# Patient Record
Sex: Male | Born: 1990 | Race: White | Hispanic: No | Marital: Married | Smoking: Never smoker
Health system: Southern US, Community
[De-identification: ages and names within clinical notes are randomized; demographics above are authoritative.]

---
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
## 2010-06-15 IMAGING — CT CT HEAD WITHOUT CONTRAST
2 series · 16 of 30 positions shown, 20 images · non-contrast
Comparison: none

REASON FOR EXAM: headache,  hit in head 1 week ago
COMMENTS:

[Series 602: without straighten · axial · non-contrast · 0.43mm/px · z∈[+40,+158]mm · 13 of 29 slices shown, 17 images]
[im 3/29  brain]
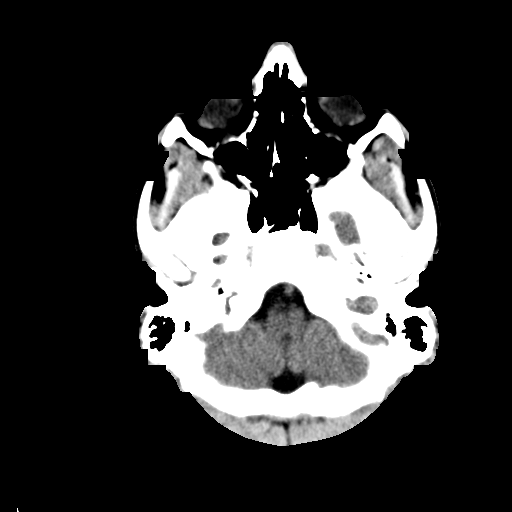
[im 3/29  bone]
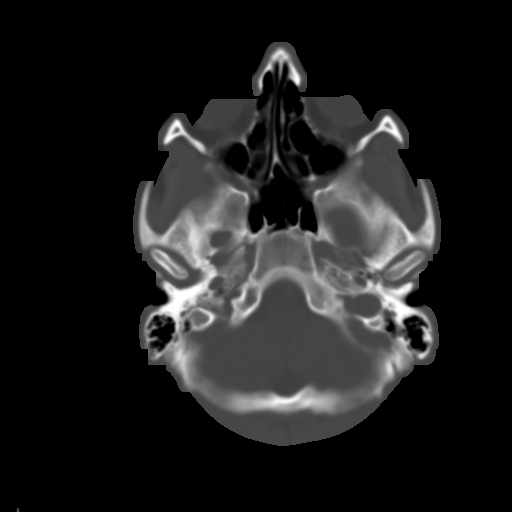
[im 5/29  brain]
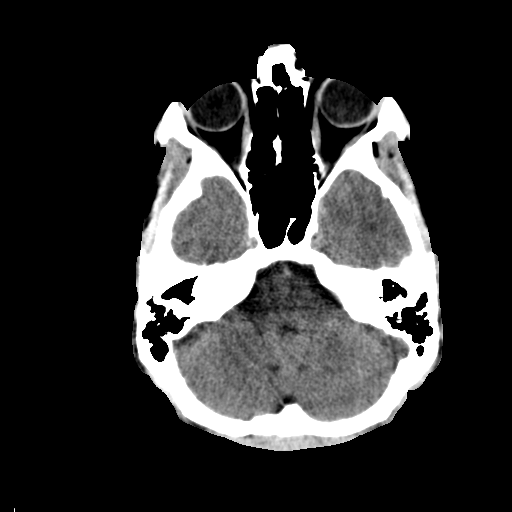
[im 7/29  brain]
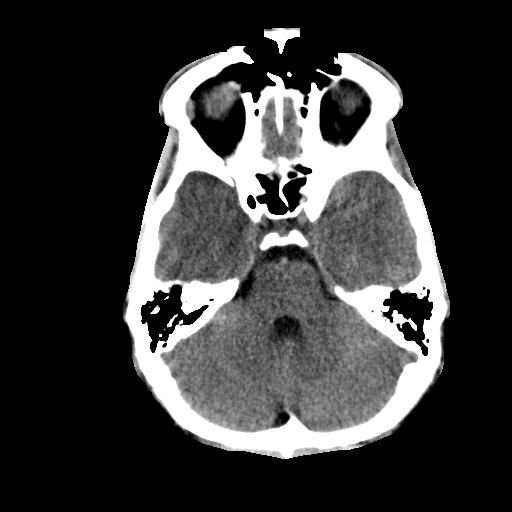
[im 9/29  brain]
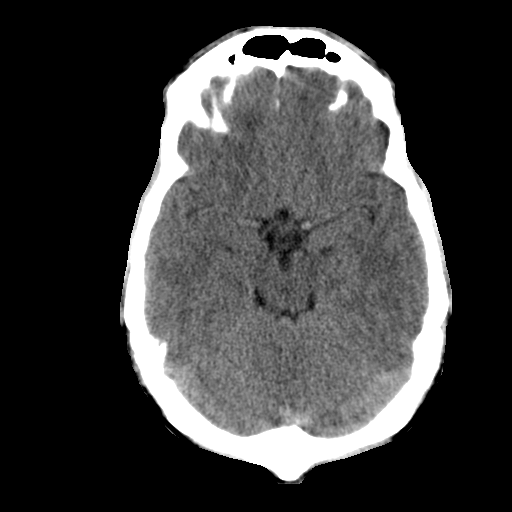
[im 11/29  brain]
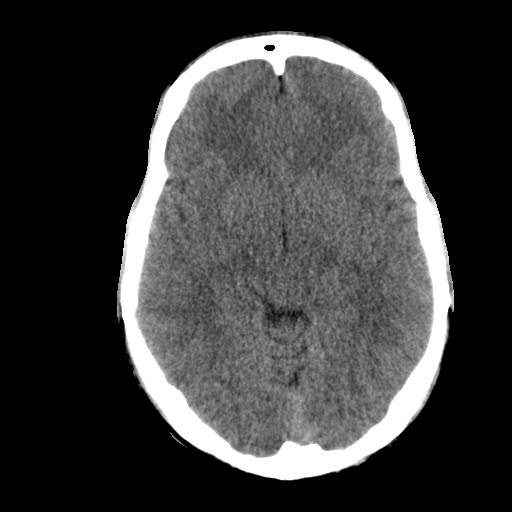
[im 11/29  bone]
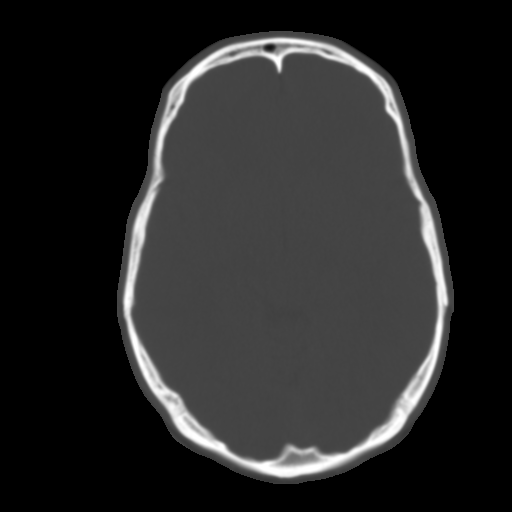
[im 13/29  brain]
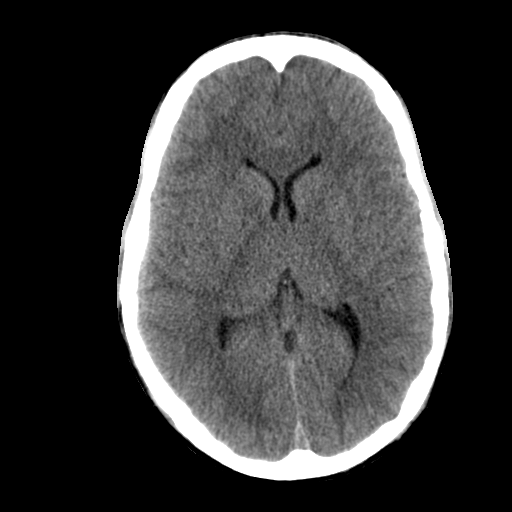
[im 15/29  brain]
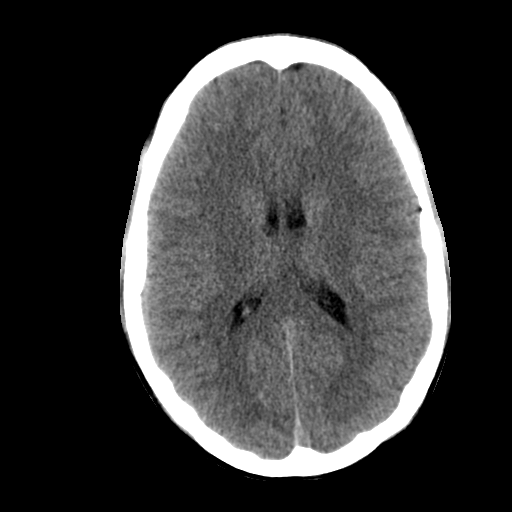
[im 17/29  brain]
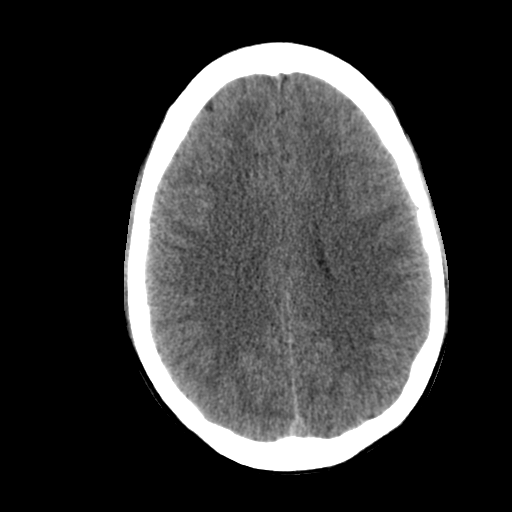
[im 19/29  brain]
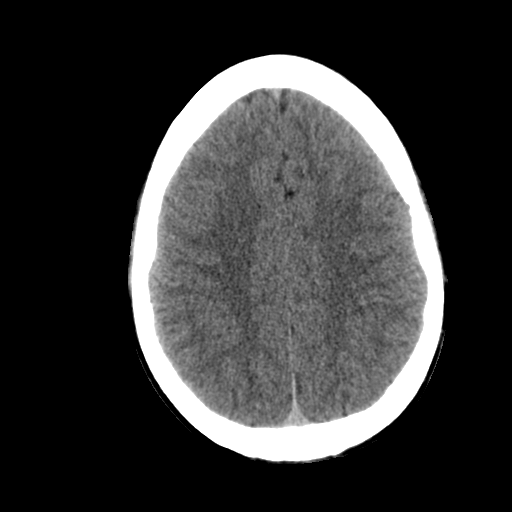
[im 19/29  bone]
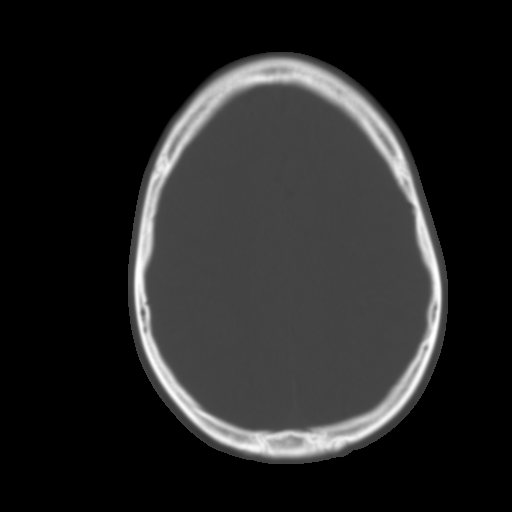
[im 21/29  brain]
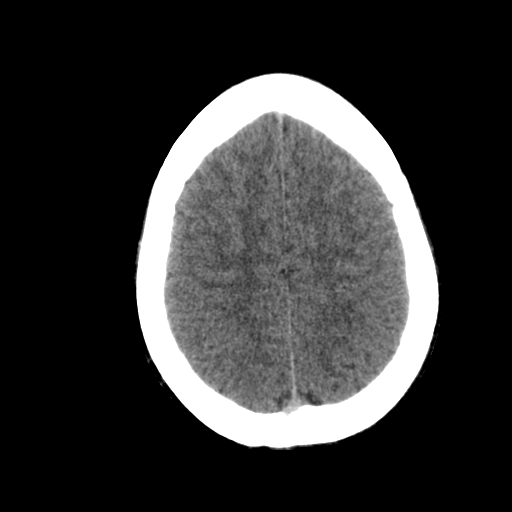
[im 23/29  brain]
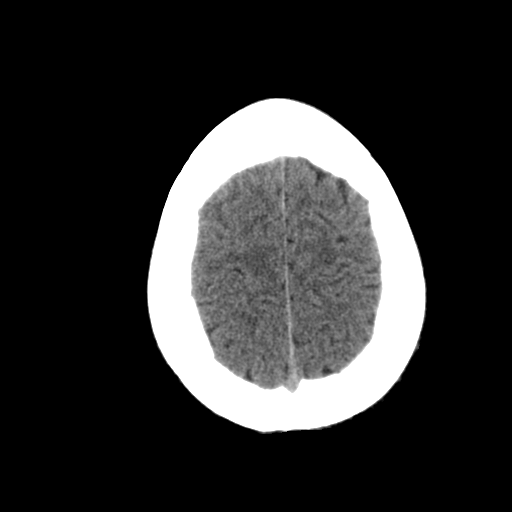
[im 25/29  brain]
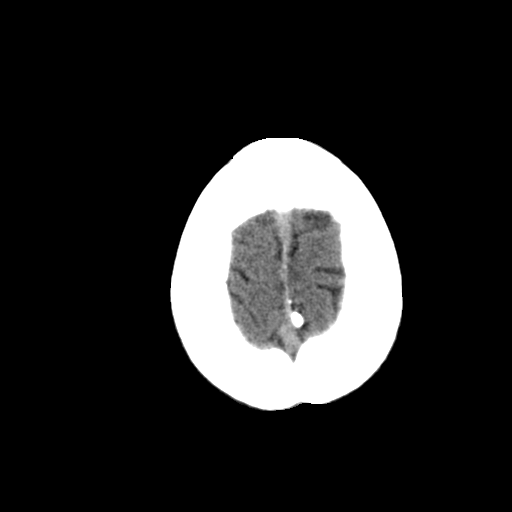
[im 27/29  brain]
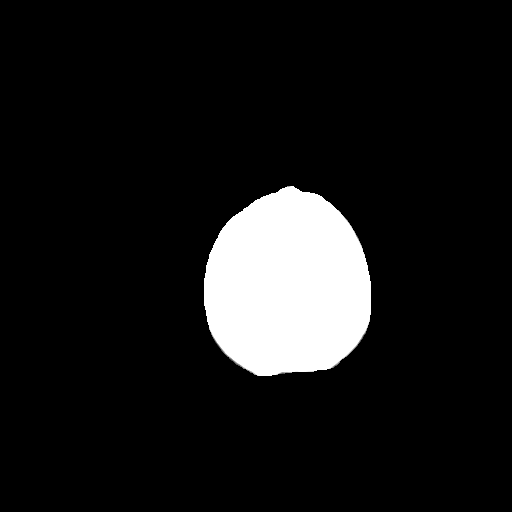
[im 27/29  bone]
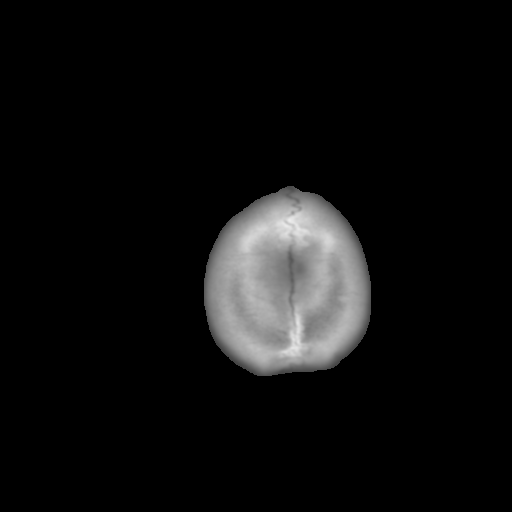

[Series 604: bone straight · axial · 0.43mm/px · z∈[+42,+81]mm · 3 of 30 slices shown]
[im 3/30  bone]
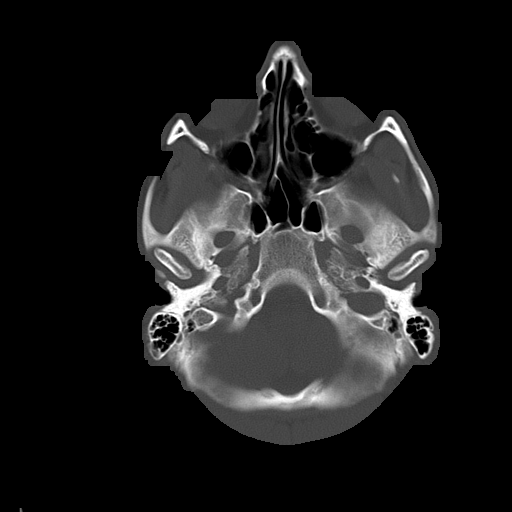
[im 7/30  bone]
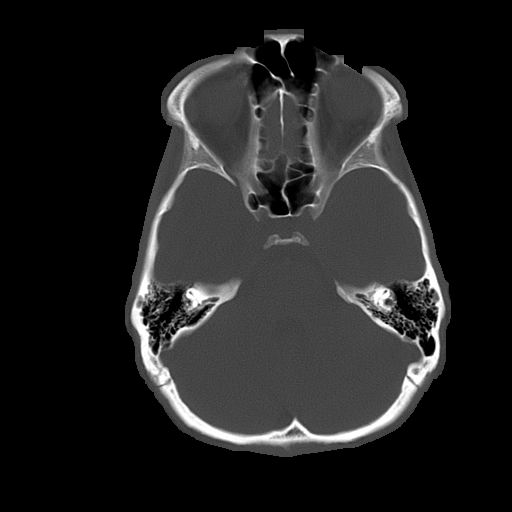
[im 11/30  bone]
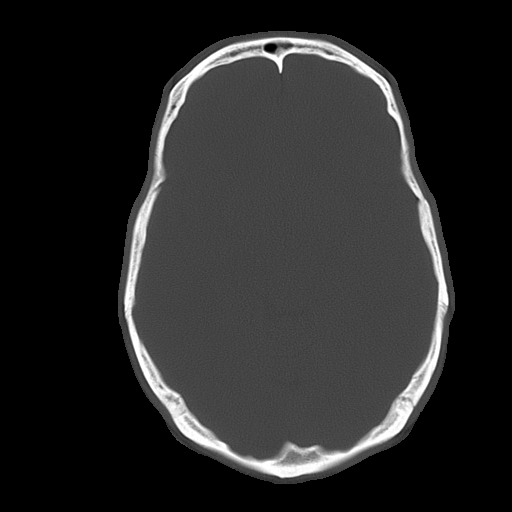

[16 of 30 positions shown; findings below may reference images not displayed]

PROCEDURE:     CT  - CT HEAD WITHOUT CONTRAST  - December 20, 2008  [DATE]

RESULT:     Noncontrast emergent CT of the brain is performed in the
standard fashion. There is no previous examination for comparison.

The ventricles and sulci are normal. There is no hemorrhage. There is no
focal mass, mass-effect or midline shift. There is no evidence of edema or
territorial infarct. The bone windows demonstrate normal aeration of the
paranasal sinuses and mastoid air cells. There is no skull fracture
demonstrated.
IMPRESSION: 1. No acute intracranial abnormality.

## 2012-02-17 HISTORY — PX: WISDOM TOOTH EXTRACTION: SHX21

## 2015-04-10 HISTORY — PX: FOOT SURGERY: SHX648

## 2016-08-31 ENCOUNTER — Ambulatory Visit (INDEPENDENT_AMBULATORY_CARE_PROVIDER_SITE_OTHER): Admitting: Family Medicine

## 2016-08-31 ENCOUNTER — Encounter: Payer: Self-pay | Admitting: Family Medicine

## 2016-08-31 VITALS — BP 117/62 | HR 86 | Temp 98.4°F | Ht 70.0 in | Wt 193.0 lb

## 2016-08-31 DIAGNOSIS — Z13220 Encounter for screening for lipoid disorders: Secondary | ICD-10-CM | POA: Diagnosis not present

## 2016-08-31 DIAGNOSIS — R358 Other polyuria: Secondary | ICD-10-CM | POA: Diagnosis not present

## 2016-08-31 DIAGNOSIS — A6000 Herpesviral infection of urogenital system, unspecified: Secondary | ICD-10-CM | POA: Insufficient documentation

## 2016-08-31 DIAGNOSIS — N62 Hypertrophy of breast: Secondary | ICD-10-CM | POA: Diagnosis not present

## 2016-08-31 DIAGNOSIS — Z833 Family history of diabetes mellitus: Secondary | ICD-10-CM | POA: Diagnosis not present

## 2016-08-31 DIAGNOSIS — R5383 Other fatigue: Secondary | ICD-10-CM

## 2016-08-31 DIAGNOSIS — G44209 Tension-type headache, unspecified, not intractable: Secondary | ICD-10-CM | POA: Insufficient documentation

## 2016-08-31 DIAGNOSIS — A6002 Herpesviral infection of other male genital organs: Secondary | ICD-10-CM | POA: Diagnosis not present

## 2016-08-31 DIAGNOSIS — Z7689 Persons encountering health services in other specified circumstances: Secondary | ICD-10-CM | POA: Diagnosis not present

## 2016-08-31 DIAGNOSIS — N644 Mastodynia: Secondary | ICD-10-CM | POA: Diagnosis not present

## 2016-08-31 DIAGNOSIS — R3589 Other polyuria: Secondary | ICD-10-CM

## 2016-08-31 MED ORDER — VALACYCLOVIR HCL 500 MG PO TABS: 500.0000 mg | ORAL_TABLET | Freq: Every day | ORAL | 3 refills | Status: DC

## 2016-08-31 NOTE — Progress Notes (Signed)
Subjective:    Patient ID: Devin Curtis, male    DOB: [birth year], 26 y.o.   MRN: [id number]  Devin Curtis is a 26 y.o. male presenting on 08/31/2016 for Establish Care (concern about diabetes, because of family history also headaches, drowiness )  Previously seen by medical staff in [employer], in [place], will request records. Now re-located to home in [place] after medical retire from [employer]. He is from [place] originally.  HPI   HEADACHES / FATIGUE / SLEEPINESS: - Reports new concerns that have been gradually present for few months without worsening or improvement, he would like blood testing done to evaluate further. He expresses concern of screening for diabetes with some family history of PreDM. - Symptoms with usually only after work, describes some headaches (frontal bilateral, temples), sensitivity sound and sometimes light, and "random tiredness" not always associated with headaches, often at end of day but can occur other times usually intermittent episodes and will feel normal without fatigue at other times. No known triggers or food/drink relation, not related to alcohol but drinks (drinks beer 1-3 peers weekly, usually on weekend) some caffeine. 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 No significant insomnia. - Takes Ibuprofen 200mg  x 2 per dose PRN about 2-3 x weekly for headaches with very good relief - Caffeine drinks sweet tea / sodas, about 2 cups daily most days, drinks about  4-5 glasses water daily - Wears contact for corrected vision, occasional glasses, no significant problems without contacts - Frequent urination mostly clear to mild yellow urine, 5-6 x per day often, admits some mild inc thirst - Reports that prior lab testing he believes sugar normal, and cholesterol normal. - Admits snoring, but no clear observed apneas per wife - Denies blurry vision, excessive daytime sleepiness, inappropriate falling asleep  History of Elevated BP, never diagnosed with HTN: - No formal diagnosis. Never on treatment for BP. He states had testing before with ECHO, that was normal.  Left Foot Surgery, 03/2015 - Since that time he was on medical leave and then eventually retired from [employer] recently in 2018. Was stationed in [place]. Now home in [place], and plans to go back to school [employer] for degree with heating/AC  GYNECOMASTIA, Male, bilateral with Intermittent Breast Pain - Reports chronic history of enlarged breast tissue since puberty as far as he can remember, gradual worsening over time, but no dramatic acute change with breast tissue. Still present despite regular exercise and weight lifting in [employer]. - He describes chronic intermittent mild discomfort with usually L > R - Family history with mother having breast cancer age >65, no known genetic component - No prior imaging - Admits previous concerns of a "nodule" maybe lymph node or other concern in Left axillary region only temporarily in past, not active - Denies any nipple discharge, persistent abnormal mass or lump or nodule  Genital Herpes Chronic problem for years, was on valtrex 500mg  daily suppression, would like to resume this to lower  flares, has about flare every 2-3 months with outbreak.  -------------------------------------------------------------------------------  Epworth Sleepiness Scale Total Score: 6 (Negative, upper limit of normal sleepiness) Sitting and reading - 1 Watching TV - 1 Sitting  inactive in a public place - 0 As a passenger in a car for an hour without a break - 2 Lying down to rest in the afternoon when circumstances permit - 2 Sitting and talking to someone - 0 Sitting quietly after a lunch without alcohol - 0 In a car, while stopped for a few minutes in traffic - 0  STOP-Bang OSA scoring Snoring yes   Tiredness yes   Observed apneas no   Pressure HTN no   BMI > 35 kg/m2 no   Age > 50  no   Neck (male >17 in; Male >16 in)  no   Gender male yes   OSA risk low (0-2)  OSA risk intermediate (3-4)  OSA risk high (5+)  Total: 3 (Low to Intermediate Risk)    Depression screen [facility] 2/9 08/31/2016  Decreased Interest 0  Down, Depressed, Hopeless 0  PHQ - 2 Score 0  Altered sleeping 0  Tired, decreased energy 0  Change in appetite 0  Feeling bad or failure about yourself  0  Trouble concentrating 0  Moving slowly or fidgety/restless 0  Suicidal thoughts 0  PHQ-9 Score 0   GAD 7 : Generalized Anxiety Score 08/31/2016  Nervous, Anxious, on Edge 0  Control/stop worrying 0  Worry too much - different things 0  Trouble relaxing 0  Restless 0  Easily annoyed or irritable 0  Afraid - awful might happen 0  Total GAD 7 Score 0  Anxiety Difficulty Not difficult at all   History reviewed. No pertinent past medical history. Past Surgical History:  Procedure Laterality Date  . FOOT SURGERY  04/10/2015  . WISDOM TOOTH EXTRACTION  2014   Social History   Social History  . Marital status: Married    Spouse name: N/A  . Number of children: 2  . Years of education: College   Occupational History  . Student [employer])     1-2 year program  . 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 Smoking status: Never Smoker  . Smokeless tobacco: Never Used  . Alcohol use 1.8 oz/week    3 Cans of beer per week     Comment: rarely, occasional  . Drug use: No  . Sexual activity: Yes   Other  Topics Concern  . Not on file   Social History Narrative  . No narrative on file   Family History  Problem Relation Age of Onset  . Cancer Mother 41       ovarian (33) / breast cancer (55)  . Ovarian cancer Mother 50  . Breast cancer Mother 68       remission  . Other Mother        Pre-Diabetes  . Heart disease Father   . Heart attack Father 83   No current outpatient prescriptions on file prior to visit.   No current facility-administered medications on file prior to visit.     Review of Systems  Constitutional: Positive for fatigue. Negative for activity change, appetite change, chills, diaphoresis, fever and unexpected weight change.  HENT: Negative for congestion, hearing loss and sinus pressure.   Eyes: Negative for visual disturbance.  Respiratory: Negative for cough, chest tightness, shortness  of breath and wheezing.   Cardiovascular: Negative for chest pain, palpitations and leg swelling.  Gastrointestinal: Negative for abdominal pain, anal bleeding, blood in stool, constipation, diarrhea, nausea and vomiting.  Endocrine: Positive for polydipsia. Negative for cold intolerance and polyuria.  Genitourinary: Negative for decreased urine volume, difficulty urinating, dysuria, frequency, hematuria, testicular pain and urgency.  Musculoskeletal: Negative for arthralgias, back pain and neck pain.  Skin: Negative for rash.  Allergic/Immunologic: Negative for environmental allergies.  Neurological: Positive for headaches. Negative for dizziness, weakness, light-headedness and numbness.  Hematological: Negative for adenopathy.  Psychiatric/Behavioral: Positive for sleep disturbance. Negative for agitation, behavioral problems, decreased concentration and dysphoric mood. The patient is not nervous/anxious.    Per HPI unless specifically indicated above     Objective:    BP 117/62 (BP Location: Right Arm, Patient Position: Sitting, Cuff Size: Large)   Pulse 86   Temp 98.4 F  (36.9 C) (Oral)   Ht 5\' 10"  (1.778 m)   Wt 193 lb (87.5 kg)   BMI 27.69 kg/m   Wt Readings from Last 3 Encounters:  08/31/16 193 lb (87.5 kg)    Physical Exam  Constitutional: He is oriented to person, place, and time. He appears well-developed and well-nourished. No distress.  Well-appearing, comfortable, cooperative, muscular athletic build  HENT:  Head: Normocephalic and atraumatic.  Mouth/Throat: Oropharynx is clear and moist.  Frontal / maxillary sinuses non-tender. Nares patent without purulence or edema. Bilateral TMs clear without erythema, effusion or bulging. Oropharynx clear without erythema, exudates, edema or asymmetry.  Mallampati Score 1 - Complete visualization of entire oropharynx soft palate  Eyes: Pupils are equal, round, and reactive to light. Conjunctivae and EOM are normal. Right eye exhibits no discharge. Left eye exhibits no discharge.  Neck: Normal range of motion. Neck supple. No thyromegaly present.  Neck circumference 15.5"  No carotid bruit  Cardiovascular: Normal rate, regular rhythm, normal heart sounds and intact distal pulses.   No murmur heard. Pulmonary/Chest: Effort normal and breath sounds normal. No respiratory distress. He has no wheezes. He has no rales.  Abdominal: Soft. Bowel sounds are normal. He exhibits no distension and no mass. There is no tenderness.  Genitourinary:  Genitourinary Comments: Declined male GU and hernia exam  Musculoskeletal: Normal range of motion. He exhibits no edema or tenderness.  Upper / Lower Extremities: - Normal muscle tone, strength bilateral upper extremities 5/5, lower extremities 5/5  Lymphadenopathy:    He has no cervical adenopathy.  Neurological: He is alert and oriented to person, place, and time.  Distal sensation intact to light touch all extremities  Skin: Skin is warm and dry. No rash noted. He is not diaphoretic. No erythema.  Bilateral gynecomastia with symmetrical enlarged breast tissue. No  palpable abnormality or mass, including bilateral axillary breast tissue. Some localized discomfort on deeper palpation L axillary breast.  Psychiatric: He has a normal mood and affect. His behavior is normal.  Well groomed, good eye contact, normal speech and thoughts, does not appear anxious  Nursing note and vitals reviewed.  No results found for this or any previous visit.    Assessment & Plan:   Problem List Items Addressed This Visit    Tension headache    Clinically most consistent with chronic tension headaches for past few months, given bilateral description, worse at end of day, resolve with NSAID, related to some physical stressors - Reassurance, no focal neuro findings on exam or other history to suggest alternative headache - Recommend continue NSAID PRN  vs Tylenol, avoid excessive dosing with one, avoid rebound - Caution caffeine - Recommend headache diary to monitor frequency and triggers - Follow-up      Gynecomastia, male - Primary    Clinically consistent with symmetrical gynecomastia in 26 yr male, seems to be stable chronic but some gradual increase, intermittent episodes of discomfort pain and possible nodularity, none active today - Concern with family history of breast cancer in mother age 45 - No other red flags for patient to suggest exact etiology - Most likely idiopathic since present since puberty  Plan: 1. Discussion and agree with starting imaging given symptomatic - proceed with bilateral breast [facility] at [facility], patient to schedule 2. Lab work-up with chemistry, CBC, and specific gynecomastia work-up testosterone, E2, LH, HCG rule out hormonal or testicular / gonadal etiology 3. Follow-up      Relevant Orders   US BREAST LTD UNI RIGHT INC AXILLA   US BREAST LTD UNI LEFT INC AXILLA   COMPLETE METABOLIC PANEL WITH GFR   CBC with Differential/Platelet   Lipid panel   Hemoglobin A1c   TSH   T4, free   Luteinizing hormone   Testosterone Total,Free,Bio,  Males   B-HCG Quant   Estradiol   Genital herpes    Stable, chronic Restart Valtrex 500mg  daily suppression      Relevant Medications   valACYclovir (VALTREX) 500 MG tablet   Fatigue    Uncertain etiology, most likely normal etiology with several stressors and lifestyle factors / changes, less likely OSA with some negative screening questionnaires, and clinically does not appear consistent based on body type. Unlikely DM without strong history. - Reassurance - Will check basic labs, chemistry, cbc, A1c - Follow-up      Relevant Orders   COMPLETE METABOLIC PANEL WITH GFR   CBC with Differential/Platelet   Hemoglobin A1c   TSH   Breast pain, left    Mild discomfort today on exam, prior intermittent episodes L > R with discomfort axillary and reported nodularity - See A&P gynecomastia Proceed with US imaging bilateral and labs       Other Visit Diagnoses    Encounter to establish care with new doctor       Family history of diabetes mellitus type II       Polyuria       Relevant Orders   Hemoglobin A1c   Screening cholesterol level       Relevant Orders   Lipid panel      Meds ordered this encounter  Medications  . DISCONTD: minocycline (MINOCIN,DYNACIN) 100 MG capsule  . valACYclovir (VALTREX) 500 MG tablet    Sig: Take 1 tablet (500 mg total) by mouth daily.    Dispense:  90 tablet    Refill:  3    Follow up plan: Return in about 6 weeks (around 10/12/2016) for Lab results, Fatigue, Headaches, Gynecomastia.    Saralyn Pilar, DO [facility] 09/01/2016, 7:15 AM

## 2016-08-31 NOTE — Patient Instructions (Addendum)
Thank you for coming to the clinic today.  1. I think most likely Tension headache - Can try Tylenol as needed (extra strength 500mg  1-2 tablets) 1-2 times a day if recurrence of headache - Can continue ibuprofen as needed up to 400-600mg  - Try to avoid overuse of either - Sometimes caffeine can cause tension or rebound headaches as well  Sleep Hygiene Recommendations to promote healthy sleep in all patients, especially if symptoms of insomnia are worsening. Due to the nature of sleep rhythms, if your body gets "out of rhythm", it may take some time before your sleep cycle can be "reset".  Please try to follow as many of the following tips as you can, usually there are only a few of these are the primary cause of the problem.  ?To reset your sleep rhythm, go to bed and get up at the same time every day ?Sleep only long enough to feel rested and then get out of bed ?Do not try to force yourself to sleep. If you can't sleep, get out of bed and try again later. ?Avoid naps during the day, unless excessively tired. The more sleeping during the day, then the less sleep your body needs at night.  ?Have coffee, tea, and other foods that have caffeine only in the morning ?Exercise several days a week, but not right before bed ?If you drink alcohol, prefer to have appropriate drink with one meal, but prefer to avoid alcohol in the evening, and bedtime ?If you smoke, avoid smoking, especially in the evening  ?Avoid watching TV or looking at phones, computers, or reading devices ("e-books") that give off light at least 30 minutes before bed. This artificial light sends "awake signals" to your brain and can make it harder to fall asleep. ?Make your bedroom a comfortable place where it is easy to fall asleep: ? Put up shades or special blackout curtains to block light from outside. ? Use a white noise machine to block noise. ? Keep the temperature cool. ?Try your best to solve or at least address  your problems before you go to bed ?Use relaxation techniques to manage stress. Ask your health care provider to suggest some techniques that may work well for you. These may include: ? Breathing exercises. ? Routines to release muscle tension. ? Visualizing peaceful scenes.  Find out if your insurance company requires an Annual Physical - if so, need to schedule next apt within 7 days of blood work for  PHYSICAL - or if not required, can be an office visit for FOLLOW-UP LABS  For Breast Ultrasound  Call the Imaging Center below anytime to schedule your own appointment now that order has been placed.  [facility] [address] [place], [place] [address] Phone: [phone]  You will be due for FASTING BLOOD WORK (no food or drink after midnight before, only water or coffee without cream/sugar on the morning of)  - Please go ahead and schedule a "Lab Only" visit in the morning at the clinic for lab draw in 1 week on Weds 09/09/16  - Make sure Lab Only appointment is at least 1-2 weeks before your next appointment, so that results will be available  For Lab Results, once available within 2-3 days of blood draw, you can can log in to MyChart online to view your results and a brief explanation. Also, we can discuss results at next follow-up visit.  Please schedule a Follow-up Appointment to: Return in  about 6 weeks (around 10/12/2016) for Lab results, Fatigue, Headaches, Gynecomastia.  If you have any other questions or concerns, please feel free to call the clinic or send a message through MyChart. You may also schedule an earlier appointment if necessary.  Additionally, you may be receiving a survey about your experience at our clinic within a few days to 1 week by e-mail or mail. We value your feedback.  Saralyn Pilar, DO [facility]   Gynecomastia, Adult Gynecomastia is an overgrowth of gland tissue  in a man's breasts. This may cause one or both breasts to become enlarged. This often develops in men who have an imbalance of the male sex hormone (testosterone) and the male sex hormone (estrogen). This means that a man may have too much estrogen, too little testosterone, or both. Gynecomastia may be a normal part of aging for some men. It can also happen to adolescent boys during puberty. What are the causes? Gynecomastia may be caused by:  Certain medicines, such as: ? Estrogen supplements and medicines that act like estrogen in the body. ? Medicines that keep testosterone from functioning normally in the body (testosterone-inhibiting drugs). ? Anabolic steroids. ? Medicines to treat heartburn, cancer, heart disease, mental health problems, HIV (human immunodeficiency virus) or AIDS (acquired immunodeficiency syndrome). ? Antibiotic medicine. ? Chemotherapy medicine.  Recreational drugs, including alcohol, marijuana, and opioids.  Herbal products, including lavender and tea tree oil.  A gene that is passed along from parent to child (inherited).  Tumors in the pituitary or adrenal gland.  An overactive thyroid gland.  Certain inherited disorders, including a genetic disease that causes low testosterone in males (Klinefelter syndrome).  Cancer of the lung, kidney, liver, testicle, or gastrointestinal tract.  Conditions that cause liver or kidney failure.  Poor nutrition and starvation.  Testicle shrinking or failure (testicularatrophy).  In some cases, the cause may not be known. What increases the risk? You may have a higher risk for gynecomastia if you:  Are 35 years old or older.  Are overweight.  Abuse alcohol or other drugs.  Have a family history of gynecomastia.  What are the signs or symptoms?  Most of the time, breast enlargement is the only symptom. The enlargement may start near the nipple, and the breast tissue may feel firm and rubbery. The breast may  feel itchy, painful or tender. How is this diagnosed? This condition may be diagnosed based on:  Your symptoms.  Your medical history.  A physical exam.  Imaging tests, such as: ? An ultrasound. ? A mammogram. ? An MRI.  Blood tests.  Removal of a sample of breast tissue to be tested in a lab (biopsy).  How is this treated? Gynecomastia may go away on its own, without treatment. If gynecomastia is caused by a medical problem or drug abuse, treatment may include:  Getting treatment for the underlying medical problem or for drug abuse.  Changing or stopping medicines.  Medicines to block the effects of estrogen.  Taking a testosterone replacement.  Surgery to remove breast tissue or any lumps in your breasts.  Breast reduction surgery. This may be a possibility if you have severe or painful gynecomastia.  Follow these instructions at home:  Take over-the-counter and prescription medicines only as told by your health care provider.  Talk to your health care provider before taking any herbal medicines or diet supplements.  Do not abuse drugs or alcohol.  Keep all follow-up visits as told by your health  care provider. This is important. Contact a health care provider if:  Your breast tissue grows larger or gets more swollen or painful.  You have a lump in your testicle.  You have blood or discharge coming from your nipples.  Your nipple changes shape.  You develop a hard or painful lump in your breast. This information is not intended to replace advice given to you by your health care provider. Make sure you discuss any questions you have with your health care provider. Document Released: 03/29/2015 Document Revised: 07/12/2015 Document Reviewed: 03/29/2015 Elsevier Interactive Patient Education  [employer]2018 [employer] Inc[employer]

## 2016-09-01 ENCOUNTER — Encounter: Payer: Self-pay | Admitting: Family Medicine

## 2016-09-01 NOTE — Assessment & Plan Note (Signed)
Stable, chronic Restart Valtrex 500mg  daily suppression

## 2016-09-01 NOTE — Assessment & Plan Note (Signed)
Mild discomfort today on exam, prior intermittent episodes L > R with discomfort axillary and reported nodularity - See A&P gynecomastia Proceed with US imaging bilateral and labs

## 2016-09-01 NOTE — Assessment & Plan Note (Signed)
Clinically consistent with symmetrical gynecomastia in [age] yr male, seems to be stable chronic but some gradual increase, intermittent episodes of discomfort pain and possible nodularity, none active today - Concern with family history of breast cancer in mother age 26 - No other red flags for patient to suggest exact etiology - Most likely idiopathic since present since puberty  Plan: 1. Discussion and agree with starting imaging given symptomatic - proceed [facility], patient to schedule 2. Lab work-up with chemistry, CBC, and specific gynecomastia work-up testosterone, E2, LH, HCG rule out hormonal or testicular / gonadal etiology 3. Follow-up

## 2016-09-01 NOTE — Assessment & Plan Note (Signed)
Clinically most consistent with chronic tension headaches for past few months, given bilateral description, worse at end of day, resolve with NSAID, related to some physical stressors - Reassurance, no focal neuro findings on exam or other history to suggest alternative headache - Recommend continue NSAID PRN vs Tylenol, avoid excessive dosing with one, avoid rebound - Caution caffeine - Recommend headache diary to monitor frequency and triggers - Follow-up

## 2016-09-01 NOTE — Assessment & Plan Note (Signed)
Uncertain etiology, most likely normal etiology with several stressors and lifestyle factors / changes, less likely OSA with some negative screening questionnaires, and clinically does not appear consistent based on body type. Unlikely DM without strong history. - Reassurance - Will check basic labs, chemistry, cbc, A1c - Follow-up

## 2016-09-08 ENCOUNTER — Ambulatory Visit (INDEPENDENT_AMBULATORY_CARE_PROVIDER_SITE_OTHER): Payer: TRICARE For Life (TFL) | Admitting: Podiatry

## 2016-09-08 ENCOUNTER — Ambulatory Visit (INDEPENDENT_AMBULATORY_CARE_PROVIDER_SITE_OTHER): Payer: TRICARE For Life (TFL)

## 2016-09-08 ENCOUNTER — Encounter: Payer: Self-pay | Admitting: Podiatry

## 2016-09-08 ENCOUNTER — Ambulatory Visit: Payer: TRICARE For Life (TFL)

## 2016-09-08 DIAGNOSIS — Z969 Presence of functional implant, unspecified: Secondary | ICD-10-CM | POA: Diagnosis not present

## 2016-09-08 DIAGNOSIS — M722 Plantar fascial fibromatosis: Secondary | ICD-10-CM

## 2016-09-08 DIAGNOSIS — M2010 Hallux valgus (acquired), unspecified foot: Secondary | ICD-10-CM

## 2016-09-08 DIAGNOSIS — R52 Pain, unspecified: Secondary | ICD-10-CM

## 2016-09-08 DIAGNOSIS — L905 Scar conditions and fibrosis of skin: Secondary | ICD-10-CM | POA: Diagnosis not present

## 2016-09-08 MED ORDER — BETAMETHASONE SOD PHOS & ACET 6 (3-3) MG/ML IJ SUSP: 3.0000 mg | Freq: Once | INTRAMUSCULAR | Status: DC

## 2016-09-08 MED ORDER — DICLOFENAC SODIUM 75 MG PO TBEC: 75.0000 mg | DELAYED_RELEASE_TABLET | Freq: Two times a day (BID) | ORAL | 0 refills | Status: DC

## 2016-09-08 NOTE — Progress Notes (Signed)
   Subjective: 26 year old otherwise healthy male presents the office today for evaluation of complications following a left foot bunionectomy was performed in 2017. Patient states that he's had joint pain ever since the surgery and has a history of bilateral arch pain. Patient was in the [employer] and experienced significant foot pathology during [employer] service.  Objective: Physical Exam General: The patient is alert and oriented x3 in no acute distress.  Dermatology: Hypertrophic scar formation noted overlying the first metatarsal approximately 4 cm in length. Skin is cool, dry and supple bilateral lower extremities. Negative for open lesions or macerations.  Vascular: Palpable pedal pulses bilaterally. No edema or erythema noted. Capillary refill within normal limits.  Neurological: Epicritic and protective threshold grossly intact bilaterally.   Musculoskeletal Exam:  Clinical evidence of bunion deformity noted to the respective foot. There is a moderate pain on palpation range of motion of the first MPJ. Lateral deviation of the hallux noted consistent with hallux abductovalgus. There is also some prominent painful orthopedic screws noted along the first metatarsal that are palpable and tenting the skin. Pain on palpation also noted to the medial calcaneal tubercle of the bilateral heels extending distally along the plantar arch.  Radiographic Exam: History of bunionectomy to the first ray in 2017 is consistent with radiographic findings. There are 2 prominent orthopedic screws in the first metatarsal. There also continues to be a mild hallux valgus deformity approximately 30. Intermetatarsal angle continues to be approximately 15. Degenerative changes also noted to the first metatarsal phalangeal joint of the left foot with joint space narrowing and sclerotic changes.  Assessment: 1. 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 Painful orthopedic hardware 2 left foot 3. Iatrogenic  painful scar formation left foot 4. Plantar fasciitis bilateral  Plan of Care:  1. Patient was evaluated. 2. Today we discussed the conservative versus surgical management of bunion deformity as well as revision of painful scar formation and removal orthopedic hardware 2 of the left foot. Today the patient opts for surgical management. All possible complications and details the procedure were explained. All patient questions were answered. No guarantees were expressed or implied. 3.  Authorization for surgery initiated today. Surgery will consist of removal painful hardware 2 left foot. Scar revision left foot. Cheilectomy left foot. 4. Orthopedic postoperative shoe was dispensed today 5. Injection of 0.5 mL Celestone Soluspan injected in the patient's bilateral heels 6. Plantar fascial braces dispensed bilateral 7. Prescription for diclofenac 75 mg 8. Return to clinic 1 week postop   Felecia ShellingBrent M. Keniya Schlotterbeck, DPM [facility]  Dr. Felecia ShellingBrent M. Estie Sproule, DPM    [address]                                        [place], [place] [address]                Office [phone]  Fax [phone]

## 2016-09-08 NOTE — Patient Instructions (Signed)
Pre-Operative Instructions  Congratulations, you have decided to take an important step to improving your quality of life.  You can be assured that the doctors of [facility] will be with you every step of the way.  1. Plan to be at the surgery center/hospital at least 1 (one) hour prior to your scheduled time unless otherwise directed by the surgical center/hospital staff.  You must have a responsible adult accompany you, remain during the surgery and drive you home.  Make sure you have directions to the surgical center/hospital and know how to get there on time. 2. For hospital based surgery you will need to obtain a history and physical form from your family physician within 1 month prior to the date of surgery- we will give you a form for you primary physician.  3. We make every effort to accommodate the date you request for surgery.  There are however, times where surgery dates or times have to be moved.  We will contact you as soon as possible if a change in schedule is required.   4. No Aspirin/Ibuprofen for one week before surgery.  If you are on aspirin, any non-steroidal anti-inflammatory medications (Mobic, Aleve, Ibuprofen) you should stop taking it 7 days prior to your surgery.  You make take Tylenol  For pain prior to surgery.  5. Medications- If you are taking daily heart and blood pressure medications, seizure, reflux, allergy, asthma, anxiety, pain or diabetes medications, make sure the surgery center/hospital is aware before the day of surgery so they may notify you which medications to take or avoid the day of surgery. 6. No food or drink after midnight the night before surgery unless directed otherwise by surgical center/hospital staff. 7. No alcoholic beverages 24 hours prior to surgery.  No smoking 24 hours prior to or 24 hours after surgery. 8. Wear loose pants or shorts- loose enough to fit over bandages, boots, and casts. 9. No slip on shoes, sneakers are best. 10. Bring  your boot with you to the surgery center/hospital.  Also bring crutches or a walker if your physician has prescribed it for you.  If you do not have this equipment, it will be provided for you after surgery. 11. If you have not been contracted by the surgery center/hospital by the day before your surgery, call to confirm the date and time of your surgery. 12. Leave-time from work may vary depending on the type of surgery you have.  Appropriate arrangements should be made prior to surgery with your employer. 13. Prescriptions will be provided immediately following surgery by your doctor.  Have these filled as soon as possible after surgery and take the medication as directed. 14. Remove nail polish on the operative foot. 15. Wash the night before surgery.  The night before surgery wash the foot and leg well with the antibacterial soap provided and water paying special attention to beneath the toenails and in between the toes.  Rinse thoroughly with water and dry well with a towel.  Perform this wash unless told not to do so by your physician.  Enclosed: 1 Ice pack (please put in freezer the night before surgery)   1 Hibiclens skin cleaner   Pre-op Instructions  If you have any questions regarding the instructions, do not hesitate to call our office.  [place]: [address] [place], [place] [address] [phone]  [place]: [address] [place], [place] [address] [phone]  [place]: [address]  [place], [place] [address] [phone]   Dr.   Norman Regal DPM, Dr. Matthew Wagoner DPM, Dr. M. Todd Hyatt DPM, Dr. Titorya Stover DPM 

## 2016-09-09 ENCOUNTER — Other Ambulatory Visit

## 2016-09-09 DIAGNOSIS — R5383 Other fatigue: Secondary | ICD-10-CM

## 2016-09-09 DIAGNOSIS — R358 Other polyuria: Secondary | ICD-10-CM

## 2016-09-09 DIAGNOSIS — N62 Hypertrophy of breast: Secondary | ICD-10-CM

## 2016-09-09 DIAGNOSIS — Z13220 Encounter for screening for lipoid disorders: Secondary | ICD-10-CM

## 2016-09-09 DIAGNOSIS — R3589 Other polyuria: Secondary | ICD-10-CM

## 2016-09-09 DIAGNOSIS — N644 Mastodynia: Secondary | ICD-10-CM

## 2016-09-09 LAB — CBC WITH DIFFERENTIAL/PLATELET
Basophils Absolute: 0 cells/uL (ref 0–200)
Basophils Relative: 0 %
Eosinophils Absolute: 0 cells/uL — ABNORMAL LOW (ref 15–500)
Eosinophils Relative: 0 %
HCT: 46.7 % (ref 38.5–50.0)
Hemoglobin: 16 g/dL (ref 13.2–17.1)
Lymphocytes Relative: 15 %
Lymphs Abs: 2070 cells/uL (ref 850–3900)
MCH: 29.3 pg (ref 27.0–33.0)
MCHC: 34.3 g/dL (ref 32.0–36.0)
MCV: 85.5 fL (ref 80.0–100.0)
MPV: 9.9 fL (ref 7.5–12.5)
Monocytes Absolute: 552 cells/uL (ref 200–950)
Monocytes Relative: 4 %
Neutro Abs: 11178 cells/uL — ABNORMAL HIGH (ref 1500–7800)
Neutrophils Relative %: 81 %
Platelets: 279 10*3/uL (ref 140–400)
RBC: 5.46 MIL/uL (ref 4.20–5.80)
RDW: 13.3 % (ref 11.0–15.0)
WBC: 13.8 10*3/uL — ABNORMAL HIGH (ref 3.8–10.8)

## 2016-09-09 LAB — COMPLETE METABOLIC PANEL WITH GFR
ALT: 26 U/L (ref 9–46)
AST: 17 U/L (ref 10–40)
Albumin: 4.5 g/dL (ref 3.6–5.1)
Alkaline Phosphatase: 83 U/L (ref 40–115)
BUN: 12 mg/dL (ref 7–25)
CO2: 25 mmol/L (ref 20–31)
Calcium: 9.2 mg/dL (ref 8.6–10.3)
Chloride: 103 mmol/L (ref 98–110)
Creat: 1.1 mg/dL (ref 0.60–1.35)
GFR, Est African American: >89 mL/min (ref >=60)
GFR, Est Non African American: >89 mL/min (ref >=60)
Glucose, Bld: 111 mg/dL — ABNORMAL HIGH (ref 65–99)
Potassium: 4 mmol/L (ref 3.5–5.3)
Sodium: 139 mmol/L (ref 135–146)
Total Bilirubin: 0.7 mg/dL (ref 0.2–1.2)
Total Protein: 6.9 g/dL (ref 6.1–8.1)

## 2016-09-09 LAB — LIPID PANEL
Cholesterol: 206 mg/dL — ABNORMAL HIGH (ref <200)
HDL: 36 mg/dL — ABNORMAL LOW (ref >40)
LDL Cholesterol: 146 mg/dL — ABNORMAL HIGH (ref <100)
Total CHOL/HDL Ratio: 5.7 Ratio — ABNORMAL HIGH (ref <5.0)
Triglycerides: 120 mg/dL (ref <150)
VLDL: 24 mg/dL (ref <30)

## 2016-09-09 LAB — T4, FREE: Free T4: 1.2 ng/dL (ref 0.8–1.8)

## 2016-09-09 LAB — TSH: TSH: 0.99 mIU/L (ref 0.40–4.50)

## 2016-09-10 LAB — LUTEINIZING HORMONE: LH: 6.3 m[IU]/mL (ref 1.5–9.3)

## 2016-09-10 LAB — HEMOGLOBIN A1C
Hgb A1c MFr Bld: 4.9 % (ref <5.7)
Mean Plasma Glucose: 94 mg/dL

## 2016-09-10 LAB — HCG, QUANTITATIVE, PREGNANCY: hCG, Beta Chain, Quant, S: <2 m[IU]/mL

## 2016-09-10 LAB — ESTRADIOL: Estradiol: 28 pg/mL (ref <=39)

## 2016-09-11 LAB — TESTOSTERONE TOTAL,FREE,BIO, MALES
Albumin: 4.5 g/dL (ref 3.6–5.1)
Sex Hormone Binding: 23 nmol/L (ref 10–50)
Testosterone, Bioavailable: 92.3 ng/dL — ABNORMAL LOW (ref 110.0–575.0)
Testosterone, Free: 44.9 pg/mL — ABNORMAL LOW (ref 46.0–224.0)
Testosterone: 271 ng/dL (ref 250–827)

## 2016-09-13 ENCOUNTER — Encounter: Payer: Self-pay | Admitting: Family Medicine

## 2016-09-15 ENCOUNTER — Encounter: Payer: Self-pay | Admitting: Podiatry

## 2016-09-15 ENCOUNTER — Telehealth: Payer: Self-pay | Admitting: Podiatry

## 2016-09-15 NOTE — Telephone Encounter (Signed)
I'm calling because Dr. Logan BoresEvans is going to perform my surgery but I haven't gotten an exact date yet. I wanted to know if I could get a note or letter for my job about possibly going on light duty or recommendation to take time off for my surgery. If you could please call me back at [phone].

## 2016-09-18 ENCOUNTER — Other Ambulatory Visit: Payer: Self-pay | Admitting: Family Medicine

## 2016-09-18 DIAGNOSIS — N62 Hypertrophy of breast: Secondary | ICD-10-CM

## 2016-09-24 ENCOUNTER — Ambulatory Visit
Admission: RE | Admit: 2016-09-24 | Discharge: 2016-09-24 | Disposition: A | Discharge status: Home / Self Care | Source: Ambulatory Visit | Attending: Family Medicine | Admitting: Family Medicine

## 2016-09-24 ENCOUNTER — Ambulatory Visit: Admission: RE | Admit: 2016-09-24 | Source: Ambulatory Visit

## 2016-09-24 DIAGNOSIS — N62 Hypertrophy of breast: Secondary | ICD-10-CM | POA: Insufficient documentation

## 2016-09-24 DIAGNOSIS — N644 Mastodynia: Secondary | ICD-10-CM | POA: Diagnosis present

## 2016-09-25 ENCOUNTER — Telehealth: Payer: Self-pay | Admitting: *Deleted

## 2016-09-25 NOTE — Telephone Encounter (Signed)
I am following up with you about scheduling your surgery.  "I am supposed to already be scheduled for August 23."  I just wanted to make sure, I saw a note but it wasn't written on your your surgical form.  I will get it scheduled.  "Has everything been authorized by my insurance?"  I have not checked with your insurance yet but I will make sure everything is authorized prior to surgery date.

## 2016-10-01 ENCOUNTER — Telehealth: Payer: Self-pay | Admitting: *Deleted

## 2016-10-01 NOTE — Telephone Encounter (Signed)
I called and asked LouAnn to make him the last patient of the day upon patient's request.

## 2016-10-01 NOTE — Telephone Encounter (Addendum)
"  I am scheduled for surgery on next Thursday, the twenty-third.  I don't think I will be able to do it due to school.  The program I'm in I have to be in a closed shoe.  They will not let me to go into class with the surgery boot on. I'm trying to schedule something after December after the 13th.  Give me a call back.  I'll be in class until 12 pm."   "This is Devin Curtis again.  I'm sorry to blow up your voicemail like this.  I think I will actually be able to keep that surgery date.  I talked to the instructor.  He said I should be fine in the walking boot or shoe.  I was going to see if I have any options of getting an afternoon appointment that way I wouldn't have to miss class.  Please give me a call back."  "I'm calling to see if I can have my surgery after 12 pm."  You will have to call the surgical center to make that arrangement.  "Okay, I'll give them a call."

## 2016-10-06 ENCOUNTER — Telehealth: Payer: Self-pay | Admitting: *Deleted

## 2016-10-06 NOTE — Telephone Encounter (Signed)
"  I called and left you a message yesterday.  I need to reschedule my surgery to December.  I have classes and I can't miss too many days or I will fail.  I'll be on my winter break after December 13 to January.  So, that will give me time to heal."  Dr. Logan Bores can do it on December 20.  "That date will be fine."  I'll get it rescheduled.

## 2016-10-12 ENCOUNTER — Ambulatory Visit (INDEPENDENT_AMBULATORY_CARE_PROVIDER_SITE_OTHER): Admitting: Family Medicine

## 2016-10-12 ENCOUNTER — Other Ambulatory Visit: Payer: Self-pay | Admitting: Family Medicine

## 2016-10-12 ENCOUNTER — Encounter: Payer: Self-pay | Admitting: Family Medicine

## 2016-10-12 VITALS — BP 134/75 | HR 93 | Temp 98.6°F | Ht 70.0 in | Wt 200.5 lb

## 2016-10-12 DIAGNOSIS — E782 Mixed hyperlipidemia: Secondary | ICD-10-CM

## 2016-10-12 DIAGNOSIS — G44209 Tension-type headache, unspecified, not intractable: Secondary | ICD-10-CM

## 2016-10-12 DIAGNOSIS — N62 Hypertrophy of breast: Secondary | ICD-10-CM

## 2016-10-12 DIAGNOSIS — R5383 Other fatigue: Secondary | ICD-10-CM | POA: Diagnosis not present

## 2016-10-12 DIAGNOSIS — D72829 Elevated white blood cell count, unspecified: Secondary | ICD-10-CM

## 2016-10-12 DIAGNOSIS — E785 Hyperlipidemia, unspecified: Secondary | ICD-10-CM | POA: Insufficient documentation

## 2016-10-12 DIAGNOSIS — R7309 Other abnormal glucose: Secondary | ICD-10-CM

## 2016-10-12 DIAGNOSIS — Z0000 Encounter for general adult medical examination without abnormal findings: Secondary | ICD-10-CM

## 2016-10-12 NOTE — Progress Notes (Signed)
Subjective:    Patient ID: Devin Curtis, male    DOB: 1990/12/07, 26 y.o.   MRN: [id number]  WYETH HOFFER is a 26 y.o. male presenting on 10/12/2016 for gynecomastia (follow up lab result)  HPI   FOLLOW-UP FATIGUE / SLEEPINESS: - Last visit with me 08/31/16, for initial visit to establish care, discussed same problem, no specific treatment given, advised sleep hygiene, blood work, see prior notes for background information. - Interval update with dramatic improvement and mostly resolution of symptoms after schedule change from working at [employer] early AM hours and poor sleep, to now back to school and has normal hours - Today patient reports no new concerns. Pleased with mostly normal lab results and normal blood sugar, due to fam history diabetes. - Regarding headaches, still has occasional stress tension headache but now much less often with better sleep, not taking OTC medicines regularly, usually self limited - Limiting caffeine - Still regular exercise weight training and cardio - Admits snoring, but no clear observed apneas per wife - Denies blurry vision, excessive daytime sleepiness, inappropriate falling asleep, fatigue, dyspnea, headaches, nausea vomiting  GYNECOMASTIA, Male, bilateral - Last visit with me 08/31/16, for initial visit for same problem, extensive lab work-up completed see prior notes for background information. - Interval update with normal testosterone (except borderline low free testosterone and bioavailable), otherwise normal TSH, Free T4, Estradiol, LH, BHCG, also mammogram and ultrasound breast tissue were normal, he was dx with idiopathic gynecomastia  - Today patient reports no new concerns. No significant changes to symptoms. No further breast pain - Denies any nipple discharge, persistent abnormal mass or lump or nodule  Social History  Substance Use Topics  . Smoking status: Never Smoker  . Smokeless tobacco: Never Used  . Alcohol use 1.8 oz/week    3  Cans of beer per week     Comment: rarely, occasional    Review of Systems   Per HPI unless specifically indicated above     Objective:    BP 134/75   Pulse 93   Temp 98.6 F (37 C) (Oral)   Ht 5' 10" (1.778 m)   Wt 200 lb 8 oz (90.9 kg)   BMI 28.77 kg/m   Wt Readings from Last 3 Encounters:  10/12/16 200 lb 8 oz (90.9 kg)  08/31/16 193 lb (87.5 kg)    Physical Exam  Constitutional: He is oriented to person, place, and time. He appears well-developed and well-nourished. No distress.  Well-appearing, comfortable, cooperative, muscular athletic build  HENT:  Head: Normocephalic and atraumatic.  Mouth/Throat: Oropharynx is clear and moist.  Eyes: Pupils are equal, round, and reactive to light. Conjunctivae and EOM are normal. Right eye exhibits no discharge. Left eye exhibits no discharge.  Neck: Normal range of motion.  Cardiovascular: Normal rate, regular rhythm, normal heart sounds and intact distal pulses.   No murmur heard. Pulmonary/Chest: Effort normal and breath sounds normal. No respiratory distress. He has no wheezes. He has no rales.  Neurological: He is alert and oriented to person, place, and time.  Skin: Skin is warm and dry. No rash noted. He is not diaphoretic. No erythema.  Stable, unchanged chronic bilateral gynecomastia with symmetrical enlarged breast tissue.  Psychiatric: He has a normal mood and affect. His behavior is normal.  Well groomed, good eye contact, normal speech and thoughts  Nursing note and vitals reviewed.  Results for orders placed or performed in visit on 09/09/16  COMPLETE METABOLIC PANEL WITH GFR  Result Value Ref Range   Sodium 139 135 - 146 mmol/L   Potassium 4.0 3.5 - 5.3 mmol/L   Chloride 103 98 - 110 mmol/L   CO2 25 20 - 31 mmol/L   Glucose, Bld 111 (H) 65 - 99 mg/dL   BUN 12 7 - 25 mg/dL   Creat 1.10 0.60 - 1.35 mg/dL   Total Bilirubin 0.7 0.2 - 1.2 mg/dL   Alkaline Phosphatase 83 40 - 115 U/L   AST 17 10 - 40 U/L   ALT  26 9 - 46 U/L   Total Protein 6.9 6.1 - 8.1 g/dL   Albumin 4.5 3.6 - 5.1 g/dL   Calcium 9.2 8.6 - 10.3 mg/dL   GFR, Est African American >89 >=60 mL/min   GFR, Est Non African American >89 >=60 mL/min  CBC with Differential/Platelet  Result Value Ref Range   WBC 13.8 (H) 3.8 - 10.8 K/uL   RBC 5.46 4.20 - 5.80 MIL/uL   Hemoglobin 16.0 13.2 - 17.1 g/dL   HCT 46.7 38.5 - 50.0 %   MCV 85.5 80.0 - 100.0 fL   MCH 29.3 27.0 - 33.0 pg   MCHC 34.3 32.0 - 36.0 g/dL   RDW 13.3 11.0 - 15.0 %   Platelets 279 140 - 400 K/uL   MPV 9.9 7.5 - 12.5 fL   Neutro Abs 11,178 (H) 1,500 - 7,800 cells/uL   Lymphs Abs 2,070 850 - 3,900 cells/uL   Monocytes Absolute 552 200 - 950 cells/uL   Eosinophils Absolute 0 (L) 15 - 500 cells/uL   Basophils Absolute 0 0 - 200 cells/uL   Neutrophils Relative % 81 %   Lymphocytes Relative 15 %   Monocytes Relative 4 %   Eosinophils Relative 0 %   Basophils Relative 0 %   Smear Review Criteria for review not met   Lipid panel  Result Value Ref Range   Cholesterol 206 (H) <200 mg/dL   Triglycerides 120 <150 mg/dL   HDL 36 (L) >40 mg/dL   Total CHOL/HDL Ratio 5.7 (H) <5.0 Ratio   VLDL 24 <30 mg/dL   LDL Cholesterol 146 (H) <100 mg/dL  Hemoglobin A1c  Result Value Ref Range   Hgb A1c MFr Bld 4.9 <5.7 %   Mean Plasma Glucose 94 mg/dL  TSH  Result Value Ref Range   TSH 0.99 0.40 - 4.50 mIU/L  T4, free  Result Value Ref Range   Free T4 1.2 0.8 - 1.8 ng/dL  Luteinizing hormone  Result Value Ref Range   LH 6.3 1.5 - 9.3 mIU/mL  Testosterone Total,Free,Bio, Males  Result Value Ref Range   Testosterone 271 250 - 827 ng/dL   Albumin 4.5 3.6 - 5.1 g/dL   Sex Hormone Binding 23 10 - 50 nmol/L   Testosterone, Free 44.9 (L) 46.0 - 224.0 pg/mL   Testosterone, Bioavailable 92.3 (L) 110.0 - 575.0 ng/dL  B-HCG Quant  Result Value Ref Range   hCG, Beta Chain, Quant, S <2.0 mIU/mL  Estradiol  Result Value Ref Range   Estradiol 28 <=39 pg/mL      Assessment &  Plan:   Problem List Items Addressed This Visit    Tension headache    Stable, controlled on improved sleep schedule - Follow-up PRN, limit caffeine, make take Tylenol, NSAID PRN      Gynecomastia, male    Stable, chronic most likely benign idiopathic gynecomastia - No other abnormality identified, only concern was mildly low bioavailable/free testosterone, otherwise not consistent  with Low T or hypogonadism, other lab/hormonal tests rule out other serious pathology - Imaging mammo/US negative  Plan: 1. 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 back at college for degree - Still some concern possible sleep issue, however screening was negative for sleep apnea, will reconsider this if worsening again in future - Labs unremarkable - Reassurance given - Follow-up PRN         No orders of the defined types were placed in this encounter.   Follow up plan: Return in about 1 year (around 10/12/2017) for Annual Physical.    Nobie Putnam, DO [facility] 10/12/2016, 10:25 PM

## 2016-10-12 NOTE — Assessment & Plan Note (Signed)
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

## 2016-10-12 NOTE — Patient Instructions (Addendum)
Thank you for coming to the clinic today.  1. Overall keep up the great work! I'm glad to hear that you are feeling better. I do not have any further questions regarding your recent blood work now that you are feeling better. I do not think low Testosterone is related. If any worsening again with fatigue or other concerns we can re-check testosterone and few other labs in 6 months if you need otherwise we can do yearly labs  Reminder for Flu Shot in Fall 2018 - either call to schedule Nurse Only visit here or may get at pharmacy and bring [place] copy with date you got it  DUE for FASTING BLOOD WORK (no food or drink after midnight before the lab appointment, only water or coffee without cream/sugar on the morning of)  SCHEDULE "Lab Only" visit in the morning at the clinic for lab draw in  1 YEAR  - Make sure Lab Only appointment is at about 1 week before your next appointment, so that results will be available  For Lab Results, once available within 2-3 days of blood draw, you can can log in to MyChart online to view your results and a brief explanation. Also, we can discuss results at next follow-up visit.  Please schedule a Follow-up Appointment to: Return in about 1 year (around 10/12/2017) for Annual Physical.  If you have any other questions or concerns, please feel free to call the clinic or send a message through MyChart. You may also schedule an earlier appointment if necessary.  Additionally, you may be receiving a survey about your experience at our clinic within a few days to 1 week by e-mail or mail. We value your feedback.  Saralyn Pilar, DO [facility], [place]

## 2016-10-12 NOTE — Assessment & Plan Note (Signed)
Stable, chronic most likely benign idiopathic gynecomastia - No other abnormality identified, only concern was mildly low bioavailable/free testosterone, otherwise not consistent with Low T or hypogonadism, other lab/hormonal tests rule out other serious pathology - Imaging mammo/US negative  Plan: 1. Reassurance, follow-up PRN

## 2016-10-12 NOTE — Assessment & Plan Note (Signed)
Stable, controlled on improved sleep schedule - Follow-up PRN, limit caffeine, make take Tylenol, NSAID PRN

## 2016-10-16 ENCOUNTER — Encounter: Payer: TRICARE For Life (TFL) | Admitting: Podiatry

## 2016-10-23 ENCOUNTER — Encounter: Payer: TRICARE For Life (TFL) | Admitting: Podiatry

## 2017-02-04 ENCOUNTER — Encounter: Payer: Self-pay | Admitting: Podiatry

## 2017-02-04 DIAGNOSIS — M2022 Hallux rigidus, left foot: Secondary | ICD-10-CM | POA: Diagnosis not present

## 2017-02-04 DIAGNOSIS — Z4889 Encounter for other specified surgical aftercare: Secondary | ICD-10-CM | POA: Diagnosis not present

## 2017-02-04 DIAGNOSIS — S91349A Puncture wound with foreign body, unspecified foot, initial encounter: Secondary | ICD-10-CM | POA: Diagnosis not present

## 2017-02-11 ENCOUNTER — Ambulatory Visit: Payer: TRICARE For Life (TFL)

## 2017-02-11 DIAGNOSIS — M2010 Hallux valgus (acquired), unspecified foot: Secondary | ICD-10-CM

## 2017-02-11 DIAGNOSIS — Z969 Presence of functional implant, unspecified: Secondary | ICD-10-CM

## 2017-02-12 ENCOUNTER — Encounter: Payer: Self-pay | Admitting: Podiatry

## 2017-02-12 ENCOUNTER — Ambulatory Visit (INDEPENDENT_AMBULATORY_CARE_PROVIDER_SITE_OTHER): Payer: TRICARE For Life (TFL) | Admitting: Podiatry

## 2017-02-12 ENCOUNTER — Ambulatory Visit (INDEPENDENT_AMBULATORY_CARE_PROVIDER_SITE_OTHER): Payer: TRICARE For Life (TFL)

## 2017-02-12 VITALS — BP 145/95 | HR 104 | Temp 97.7°F | Resp 16

## 2017-02-12 DIAGNOSIS — Z969 Presence of functional implant, unspecified: Secondary | ICD-10-CM

## 2017-02-12 DIAGNOSIS — M2010 Hallux valgus (acquired), unspecified foot: Secondary | ICD-10-CM | POA: Diagnosis not present

## 2017-02-12 DIAGNOSIS — Z9889 Other specified postprocedural states: Secondary | ICD-10-CM

## 2017-02-18 NOTE — Progress Notes (Signed)
   Subjective:  Patient presents today status post colectomy and removal of hardware of the left foot. DOS: 02/04/17.  He states he is doing well overall.  He reports the pain is minimal.  He denies fever or chills.  Patient is here for further evaluation and treatment.  No past medical history on file.    Objective/Physical Exam Skin incisions appear to be well coapted with sutures and staples intact. No sign of infectious process noted. No dehiscence. No active bleeding noted. Moderate edema noted to the surgical extremity.  Radiographic Exam:  Osteotomies sites appear to be stable with routine healing.  Assessment: 1. s/p colectomy and removal of hardware left foot. DOS: 02/04/17   Plan of Care:  1. Patient was evaluated. X-rays reviewed 2.  Dressing change. 3.  Continue weightbearing in postop shoe. 4.  Return to clinic in 1 week for staple removal.   Felecia ShellingBrent M. Kainen Struckman, DPM [facility]  Dr. Felecia ShellingBrent M. Ibn Stief, DPM    [address]                                        [place], [place] [address]                Office [phone]  Fax [phone]

## 2017-02-19 ENCOUNTER — Ambulatory Visit (INDEPENDENT_AMBULATORY_CARE_PROVIDER_SITE_OTHER): Payer: TRICARE For Life (TFL) | Admitting: Podiatry

## 2017-02-19 ENCOUNTER — Encounter: Payer: Self-pay | Admitting: Podiatry

## 2017-02-19 DIAGNOSIS — Z9889 Other specified postprocedural states: Secondary | ICD-10-CM

## 2017-02-21 NOTE — Progress Notes (Signed)
   Subjective:  Patient presents today status post cheilectomy and removal of hardware of the left foot. DOS: 02/04/17.  He reports he is doing well and states the incisions look good.  Patient is here for further evaluation and treatment.  No past medical history on file.    Objective/Physical Exam Skin incisions appear to be well coapted with sutures and staples intact. No sign of infectious process noted. No dehiscence. No active bleeding noted. Moderate edema noted to the surgical extremity.  Assessment: 1. s/p cheilectomy and removal of hardware left foot. DOS: 02/04/17   Plan of Care:  1.  Patient was evaluated.  2.  Staples removed. 3.  Resume wearing normal shoe gear. 4.  Continue oral meloxicam. 5.  Return to clinic in 4 weeks to address plantar fasciitis bilaterally.   Felecia ShellingBrent M. Evans, DPM [facility]  Dr. Felecia ShellingBrent M. Evans, DPM    [address]                                        [place], [place] [address]                Office [phone]  Fax [phone]

## 2017-03-19 ENCOUNTER — Ambulatory Visit: Payer: TRICARE For Life (TFL) | Admitting: Podiatry

## 2017-06-02 ENCOUNTER — Telehealth: Payer: Self-pay | Admitting: Podiatry

## 2017-06-02 NOTE — Telephone Encounter (Signed)
I'm a pt of Dr. Logan BoresEvans' and I am calling to see if I can a copy of my medical records for [facility] purposes. Please call me back at [phone]. Thank you.

## 2017-08-22 ENCOUNTER — Other Ambulatory Visit: Payer: Self-pay | Admitting: Family Medicine

## 2017-08-22 DIAGNOSIS — A6002 Herpesviral infection of other male genital organs: Secondary | ICD-10-CM

## 2017-12-06 ENCOUNTER — Other Ambulatory Visit: Payer: Self-pay | Admitting: Family Medicine

## 2017-12-06 DIAGNOSIS — A6002 Herpesviral infection of other male genital organs: Secondary | ICD-10-CM

## 2018-03-10 ENCOUNTER — Other Ambulatory Visit: Payer: Self-pay | Admitting: Family Medicine

## 2018-03-10 DIAGNOSIS — A6002 Herpesviral infection of other male genital organs: Secondary | ICD-10-CM

## 2018-03-22 ENCOUNTER — Ambulatory Visit (INDEPENDENT_AMBULATORY_CARE_PROVIDER_SITE_OTHER): Admitting: Family Medicine

## 2018-03-22 ENCOUNTER — Encounter: Payer: Self-pay | Admitting: Family Medicine

## 2018-03-22 VITALS — BP 118/70 | HR 106 | Temp 99.3°F | Resp 16 | Ht 71.0 in | Wt 204.0 lb

## 2018-03-22 DIAGNOSIS — G4733 Obstructive sleep apnea (adult) (pediatric): Secondary | ICD-10-CM | POA: Insufficient documentation

## 2018-03-22 DIAGNOSIS — R29818 Other symptoms and signs involving the nervous system: Secondary | ICD-10-CM

## 2018-03-22 DIAGNOSIS — N62 Hypertrophy of breast: Secondary | ICD-10-CM | POA: Diagnosis not present

## 2018-03-22 DIAGNOSIS — A6002 Herpesviral infection of other male genital organs: Secondary | ICD-10-CM

## 2018-03-22 MED ORDER — VALACYCLOVIR HCL 500 MG PO TABS: 500.0000 mg | ORAL_TABLET | Freq: Every day | ORAL | 3 refills | Status: DC

## 2018-03-22 NOTE — Progress Notes (Signed)
Subjective:    Patient ID: Devin Curtis, male    DOB: Jun 19, 1990, 28 y.o.   MRN: [id number]  Devin Curtis is a 28 y.o. male presenting on 03/22/2018 for Breast Pain (gynecomastia) and Sleep Apnea   HPI   SUSPECTED SLEEP APNEA / SLEEPINESS In past 2018, he has been evaluated for possible OSA, had negative screening at that time. Also thought some of his symptoms were due to abnormal hours overall and schedule.  - He reports overall now has worsening symptoms daytime sleepiness and tired. He dozes off now more often. Admits that his wife has witnessed sleep apnea, stopped breathing, has happened occasional He admits waking up tired often, dry mouth and sore throat at times  Epworth Sleepiness Scale Total Score: 9 - positive Sitting and reading - 2 Watching TV - 3 Sitting inactive in a public place - 0 As a passenger in a car for an hour without a break - 2 Lying down to rest in the afternoon when circumstances permit - 2 Sitting and talking to someone - 0 Sitting quietly after a lunch without alcohol - 0 In a car, while stopped for a few minutes in traffic - 0  STOP-Bang OSA scoring Snoring yes   Tiredness yes   Observed apneas yes   Pressure HTN no   BMI > 35 kg/m2 no   Age > 50  no   Neck (male >17 in; Male >16 in)  no < 17"  Gender male yes   OSA risk low (0-2)  OSA risk intermediate (3-4)  OSA risk high (5+)  Total: 4 Intermediate Risk   ---------------------------------------  FOLLOW-UP GYNECOMASTIA, Male, bilateral Last visit 09/2016, he had Diagnostic Mammogram and Ultrasound LEFT that showed benign findings overall, no new concerns identified on mammogram, he also had lab work-up in past with hormonal and other testing that was negative. - Today he reports still has stable chronic gynecomastia, unchanged since puberty >15 years now. - He admits has episodic sharp pain at time if breast tissue is "pinched" or other trigger, has a small nodule usually in R breast  tissue seems to trigger symptoms, no actual chronic pain or long term symptoms. Seems to come and go. 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  [place] Asking about [place], [place] is unsure if this is a problem or not. He has never had Gardisil HPV vaccine. He does not have any skin problem, but wanted to be sure if there was any other testing he needed. Denies genital or penile wart  Additional social history update Under [employer] Bill. 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  [facility]2018 08/31/2016  Decreased Interest 0 0  Down, Depressed, Hopeless 0 0  PHQ - 2 Score 0 0  Altered sleeping - 0  Tired, decreased energy - 0  Change in appetite - 0  Feeling bad or failure about yourself  - 0  Trouble concentrating - 0  Moving slowly or fidgety/restless - 0  Suicidal thoughts - 0  PHQ-9 Score - 0  History reviewed. No pertinent past medical history. Past Surgical History:  Procedure Laterality Date  . FOOT SURGERY  04/10/2015  . WISDOM TOOTH EXTRACTION  2014   Social History   Socioeconomic History  . Marital status: Married    Spouse name: Not on file  . Number of children: 2  . Years of education: College  . Highest education level: Not on file  Occupational History  . 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 Occupation: Retired Corporate treasurer (2018)    Comment: Medical leave after [place]  . Financial resource strain: Not on file  . Food insecurity:    Worry: Not on file    Inability:  Not on file  . Transportation needs:    Medical: Not on file    Non-medical: Not on file  Tobacco Use  . Smoking status: Never Smoker  . Smokeless tobacco: Never Used  Substance and Sexual Activity  . Alcohol use: Yes    Alcohol/week: 3.0 standard drinks    Types: 3 Cans of beer per week    Comment: rarely, occasional  . Drug use: No  . Sexual activity: Yes  Lifestyle  . Physical activity:    Days per week: Not on file    Minutes per session: Not on file  . Stress: Not on file  Relationships  . Social connections:    Talks on phone: Not on file    Gets together: Not on file    Attends religious service: Not on file    Active member of club or organization: Not on file    Attends meetings of clubs or organizations: Not on file    Relationship status: Not on file  . Intimate partner violence:    Fear of current or ex partner: Not on file    Emotionally abused: Not on file    Physically abused: Not on file    Forced sexual activity: Not on file  Other Topics Concern  . Not on file  Social History Narrative  . Not on file   Family History  Problem Relation Age of Onset  . Cancer Mother 30       ovarian (96) / breast cancer (60)  . Ovarian cancer Mother 33  . Breast cancer Mother 15       remission  . Other Mother        Pre-Diabetes  . Heart disease Father   . Heart attack Father 40   No current outpatient medications on file prior to visit.   No current facility-administered medications on file prior to visit.     Review of Systems Per HPI unless specifically indicated above      Objective:    BP 118/70 (BP Location: Left Arm, Cuff Size: Normal)   Pulse (!) 106   Temp 99.3 F (37.4 C) (Oral)   Resp 16   Ht _0  (1.803 m)   Wt 204 lb (92.5 kg)   BMI 28.45 kg/m   Wt Readings from Last 3 Encounters:  03/22/18 204 lb (92.5 kg)  10/12/16 200 lb 8 oz (90.9 kg)  08/31/16 193 lb (87.5 kg)    Physical Exam Vitals signs and nursing note reviewed.    Constitutional:      General: He is not in acute distress.    Appearance: He is well-developed. He is not diaphoretic.     Comments: Well-appearing, comfortable, cooperative  HENT:  Head: Normocephalic and atraumatic.     Comments: Nares patent without purulence or edema. Oropharynx clear without erythema, exudates, edema or asymmetry.  No tonsillar hypertrophy  Mallampati Score 1 - Complete visualization of entire oropharynx soft palate Neck:     Musculoskeletal: Normal range of motion and neck supple.     Thyroid: No thyromegaly.     Comments: Neck circumference 16.5" Cardiovascular:     Rate and Rhythm: Normal rate and regular rhythm.     Heart sounds: Normal heart sounds. No murmur.  Pulmonary:     Effort: Pulmonary effort is normal. No respiratory distress.     Breath sounds: Normal breath sounds. No wheezing or rales.     Comments: Good air movement. Chest:     Breasts: Breasts are symmetrical.        Right: No swelling, inverted nipple, skin change or tenderness.        Left: No swelling, inverted nipple, skin change or tenderness.    Musculoskeletal: Normal range of motion.  Lymphadenopathy:     Cervical: No cervical adenopathy.  Skin:    General: Skin is warm and dry.     Findings: No erythema or rash.     Comments: Bilateral Gynecomastia, symmetrical  Neurological:     Mental Status: He is alert and oriented to person, place, and time.  Psychiatric:        Behavior: Behavior normal.     Comments: Well groomed, good eye contact, normal speech and thoughts    Results for orders placed or performed in visit on 09/09/16  COMPLETE METABOLIC PANEL WITH GFR  Result Value Ref Range   Sodium 139 135 - 146 mmol/L   Potassium 4.0 3.5 - 5.3 mmol/L   Chloride 103 98 - 110 mmol/L   CO2 25 20 - 31 mmol/L   Glucose, Bld 111 (H) 65 - 99 mg/dL   BUN 12 7 - 25 mg/dL   Creat 1.10 0.60 - 1.35 mg/dL   Total Bilirubin 0.7 0.2 - 1.2 mg/dL   Alkaline Phosphatase 83 40 - 115  U/L   AST 17 10 - 40 U/L   ALT 26 9 - 46 U/L   Total Protein 6.9 6.1 - 8.1 g/dL   Albumin 4.5 3.6 - 5.1 g/dL   Calcium 9.2 8.6 - 10.3 mg/dL   GFR, Est African American >89 >=60 mL/min   GFR, Est Non African American >89 >=60 mL/min  CBC with Differential/Platelet  Result Value Ref Range   WBC 13.8 (H) 3.8 - 10.8 K/uL   RBC 5.46 4.20 - 5.80 MIL/uL   Hemoglobin 16.0 13.2 - 17.1 g/dL   HCT 46.7 38.5 - 50.0 %   MCV 85.5 80.0 - 100.0 fL   MCH 29.3 27.0 - 33.0 pg   MCHC 34.3 32.0 - 36.0 g/dL   RDW 13.3 11.0 - 15.0 %   Platelets 279 140 - 400 K/uL   MPV 9.9 7.5 - 12.5 fL   Neutro Abs 11,178 (H) 1,500 - 7,800 cells/uL   Lymphs Abs 2,070 850 - 3,900 cells/uL   Monocytes Absolute 552 200 - 950 cells/uL   Eosinophils Absolute 0 (L) 15 - 500 cells/uL   Basophils Absolute 0 0 - 200 cells/uL   Neutrophils Relative % 81 %   Lymphocytes Relative 15 %   Monocytes Relative 4 %   Eosinophils Relative 0 %   Basophils Relative 0 %   Smear Review Criteria for review not met   Lipid panel  Result Value Ref Range   Cholesterol 206 (H) <200 mg/dL   Triglycerides 120 <150 mg/dL   HDL 36 (L) >40 mg/dL   Total CHOL/HDL Ratio 5.7 (H) <5.0 Ratio   VLDL 24 <30 mg/dL   LDL Cholesterol 146 (H) <100 mg/dL  Hemoglobin A1c  Result Value Ref Range   Hgb A1c MFr Bld 4.9 <5.7 %   Mean Plasma Glucose 94 mg/dL  TSH  Result Value Ref Range   TSH 0.99 0.40 - 4.50 mIU/L  T4, free  Result Value Ref Range   Free T4 1.2 0.8 - 1.8 ng/dL  Luteinizing hormone  Result Value Ref Range   LH 6.3 1.5 - 9.3 mIU/mL  Testosterone Total,Free,Bio, Males  Result Value Ref Range   Testosterone 271 250 - 827 ng/dL   Albumin 4.5 3.6 - 5.1 g/dL   Sex Hormone Binding 23 10 - 50 nmol/L   Testosterone, Free 44.9 (L) 46.0 - 224.0 pg/mL   Testosterone, Bioavailable 92.3 (L) 110.0 - 575.0 ng/dL  B-HCG Quant  Result Value Ref Range   hCG, Beta Chain, Quant, S <2.0 mIU/mL  Estradiol  Result Value Ref Range   Estradiol 28  <=39 pg/mL      Assessment & Plan:   Problem List Items Addressed This Visit    Gynecomastia, male    Stable chronic benign idiopathic gynecomastia since puberty Prior work-up Diagnostic Mammo / L [place], and lab work-up not consistent with etiology  Plan Discussion today with some episodic sharp pain provoked symptoms possible glandular tissue within breast, if bothersome, in future would recommend consider refer to General Surgery for 2nd opinion consultation, may consider future medication options. 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 - Screening: ESS score 9 / STOP-Bang Score 4 - Neck Circumference: 16.5" - Co-morbidities: none  Plan: 1. Discussion on initial diagnosis and testing for OSA, risk factors, management, complications 2. Agree to proceed with sleep study testing based on clinical concerns - advised patient to first contact insurance to determine which route for sleep study, Sleep Center vs In Home, notify office once determined, and will place order at that time to initiate testing         Meds ordered this encounter  Medications  . valACYclovir (VALTREX) 500 MG tablet    Sig: Take 1 tablet (500 mg total) by mouth daily.    Dispense:  90 tablet    Refill:  3    Keep on file until patient is ready, added refills    Follow up plan: Return in about 3 months (around 06/20/2018) for Follow-up sleep study / ?OSA, breast  pain.  ANobie Putnam [facility] 03/22/2018, 2:21 PM

## 2018-03-22 NOTE — Patient Instructions (Addendum)
Thank you for coming to the office today.  I am concerned about sleep apnea - Usually we recommend [facility] - in [place]  - Polysomnography (PSG) CPT code is 95811 - Home unattended sleep study CPT code 69629  Find out if or which is covered, and where can we send you. If you would like to proceed with this location just call or message and we can fax the order.  -------------------  Refilled Valtrex.  HPV is very common, except rare types can cause various cancers - anal, cervical, oral - usually there is a physical problem identified and this is treated, otherwise we do not routinely test or screen patients, because almost everyone has been exposed to some type of HPV.  GENERAL SURGERY  If ready to get a 2nd opinion consultation, let me know or let [facility] know to find out where to refer you to, here are some suggestions.  Surgery is likely not required, but they can help discuss treatment options.  [facility] [place] Location [address] [place], [place] [address] Hours: 8:30am to 5pm (M-F) Ph [phone]  [facility] Location [address] [place], [place]  [address] Hours: 8:30am to 5pm (M-F) Ph [phone]  [facility] [address] [place],  [place]  [address] Phone: [phone]  Please schedule a Follow-up Appointment to: Return in about 3 months (around 06/20/2018) for Follow-up sleep study / ?OSA, breast pain.  If you have any other questions or concerns, please feel free to call the office or send a message through MyChart. You may also schedule an earlier appointment if necessary.  Additionally, you may be receiving a survey about your experience at our office within a few days to 1 week by e-mail or mail. We value your feedback.  Saralyn Pilar, DO [facility], [place]

## 2018-03-22 NOTE — Assessment & Plan Note (Addendum)
Now worsening, persistent clinical concern for suspected obstructive sleep apnea given reported symptoms with witnessed apnea, snoring and sleep disturbance, worse fatigue excessive sleepiness. - Screening: ESS score 9 / STOP-Bang Score 4 - Neck Circumference: 16.5" - Co-morbidities: none  Plan: 1. Discussion on initial diagnosis and testing for OSA, risk factors, management, complications 2. Agree to proceed with sleep study testing based on clinical concerns - advised patient to first contact insurance to determine which route for sleep study, Sleep Center vs In Home, notify office once determined, and will place order at that time to initiate testing

## 2018-03-22 NOTE — Assessment & Plan Note (Signed)
Stable chronic benign idiopathic gynecomastia since puberty Prior work-up Diagnostic Mammo / L [place], and lab work-up not consistent with etiology  Plan Discussion today with some episodic sharp pain provoked symptoms possible glandular tissue within breast, if bothersome, in future would recommend consider refer to General Surgery for 2nd opinion consultation, may consider future medication options. Otherwise can observe and monitor on his own if significant concern return and would recommend surveillance with mammogram / imaging every 3-5 years for now

## 2018-03-22 NOTE — Assessment & Plan Note (Signed)
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 

## 2018-04-21 ENCOUNTER — Telehealth: Payer: Self-pay | Admitting: Family Medicine

## 2018-04-21 NOTE — Telephone Encounter (Signed)
Patient advised.

## 2018-04-21 NOTE — Telephone Encounter (Signed)
Last visit 03/22/18 discussed suspected sleep apnea.  Fax form for order PSG placed today 04/21/18 - to be faxed. Copied insurance information as well.  Please let patient know that we will fax his order to [facility] - they will contact him next with updates, if he has questions he may contact them in next 1-2 week as this is scheduled and arranged.  Saralyn Pilar, DO [facility] 04/21/2018, 1:21 PM

## 2018-04-21 NOTE — Telephone Encounter (Signed)
Pt called said sleep study was coverage under his insurance.  Pt call back # is  [phone]

## 2018-05-13 ENCOUNTER — Encounter: Payer: Self-pay | Admitting: Family Medicine

## 2018-07-04 ENCOUNTER — Ambulatory Visit: Admitting: Family Medicine

## 2019-05-09 DIAGNOSIS — A6002 Herpesviral infection of other male genital organs: Secondary | ICD-10-CM

## 2019-05-09 MED ORDER — VALACYCLOVIR HCL 500 MG PO TABS: 500.0000 mg | ORAL_TABLET | Freq: Every day | ORAL | 0 refills | Status: DC

## 2019-05-28 ENCOUNTER — Other Ambulatory Visit: Payer: Self-pay

## 2019-05-28 DIAGNOSIS — A6002 Herpesviral infection of other male genital organs: Secondary | ICD-10-CM

## 2019-07-30 IMAGING — US US BREAST*L* LIMITED INC AXILLA
1 series · 9 of 9 positions shown · non-contrast
Comparison: Baseline evaluation

ACR Breast Density Category a: The breast tissue is almost entirely
fatty.

CLINICAL DATA: Palpable abnormality in the upper-outer quadrant of
the left breast. Mass comes and goes and is sometimes associated
with tenderness. The patient's mother was diagnosed with breast and
ovarian cancer.

EXAM:
2D DIGITAL DIAGNOSTIC BILATERAL MAMMOGRAM WITH CAD AND ADJUNCT TOMO
ULTRASOUND LEFT BREAST

[Series 1: us breast*left* limited inc axilla · 0.06mm/px · 9 of 9 slices shown]
[im 1/9]
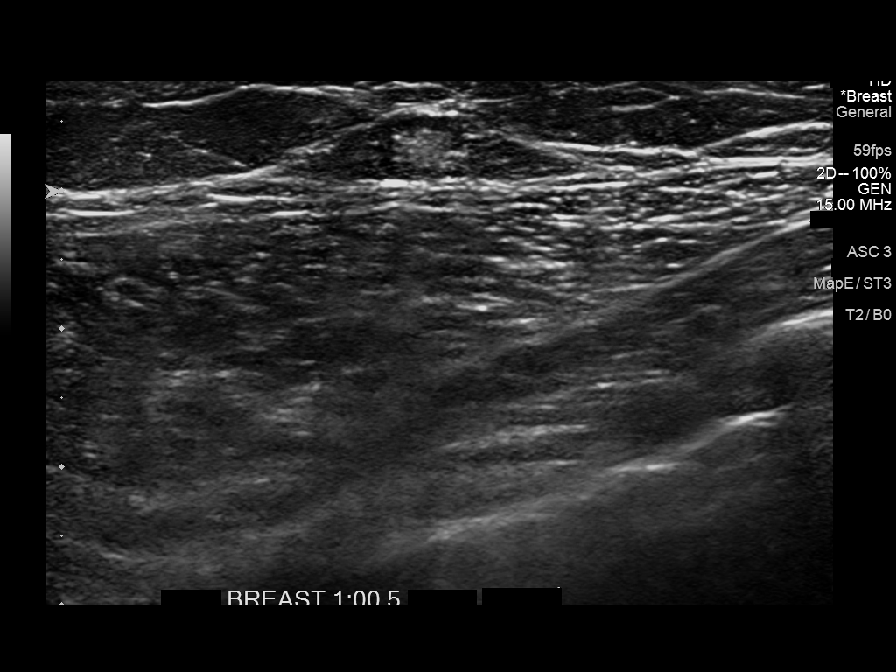
[im 2/9]
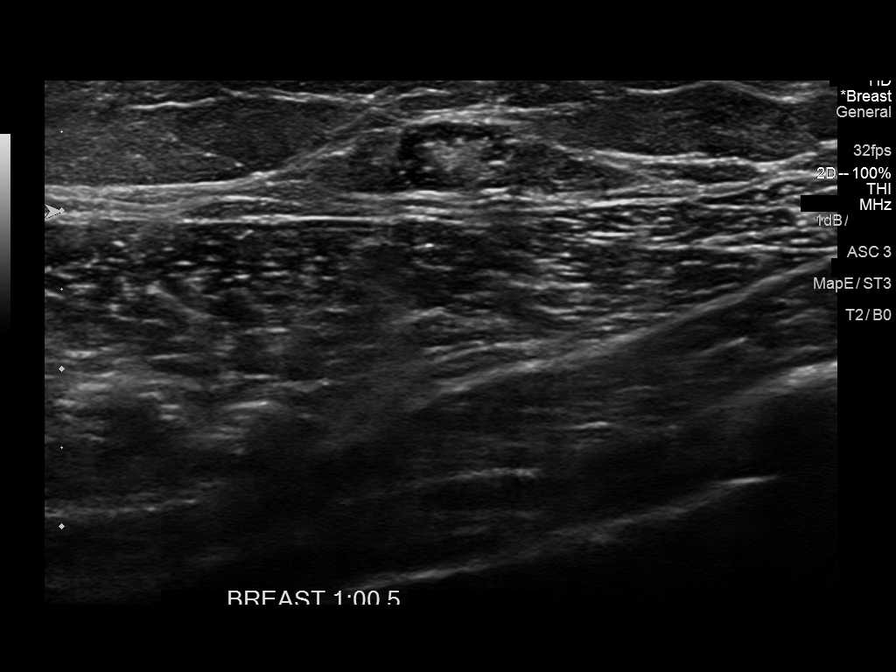
[im 3/9]
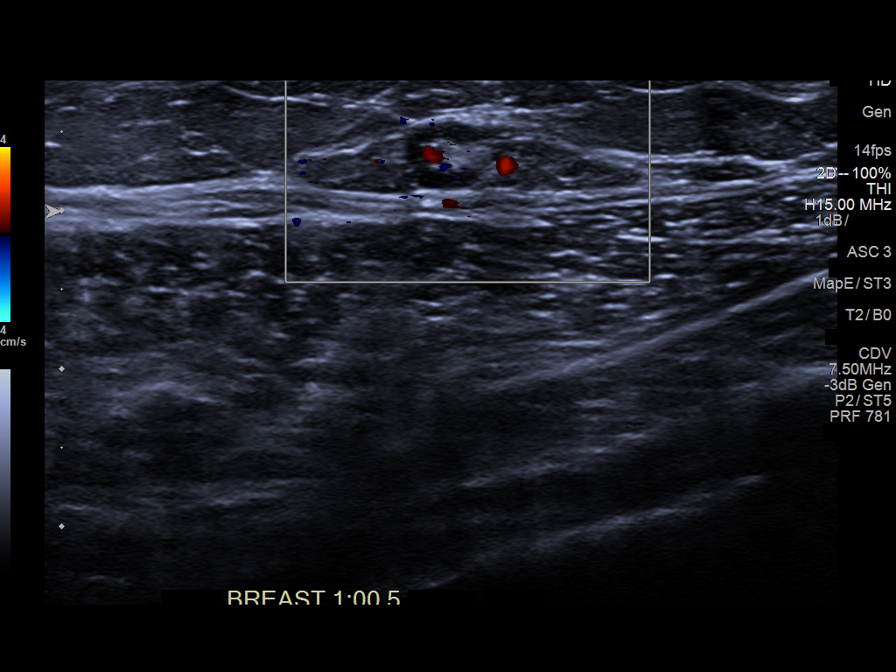
[im 4/9]
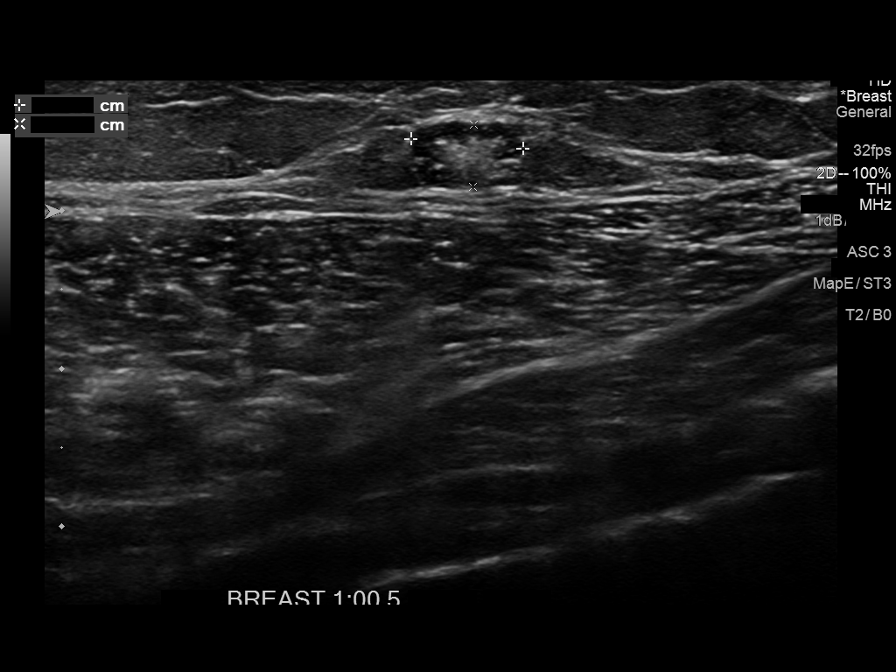
[im 5/9]
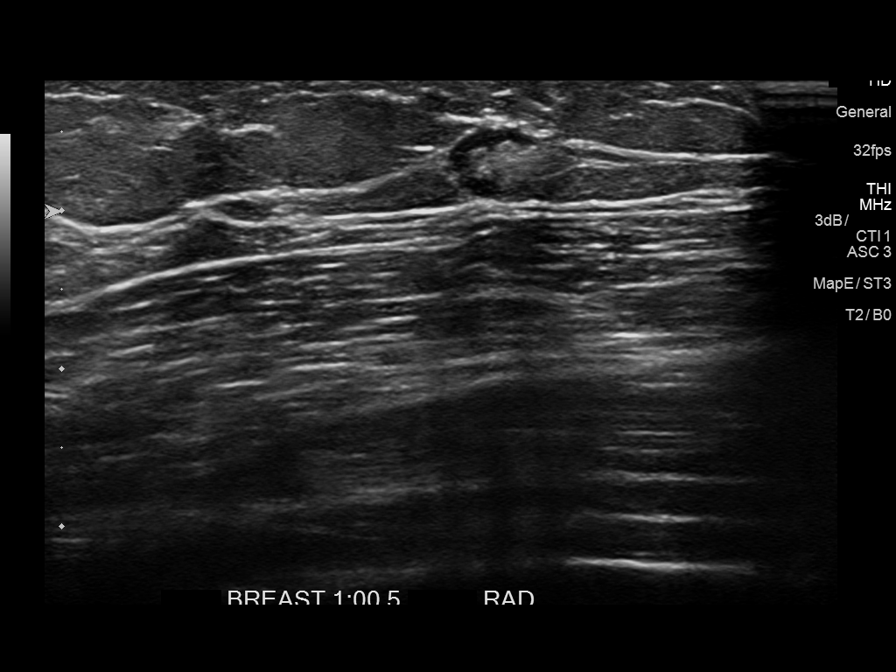
[im 6/9]
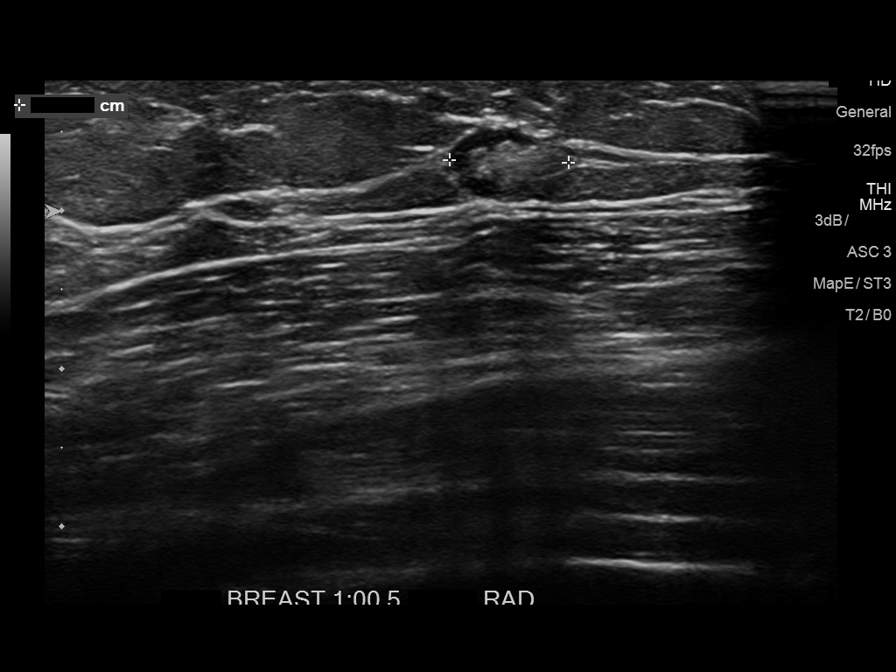
[im 7/9]
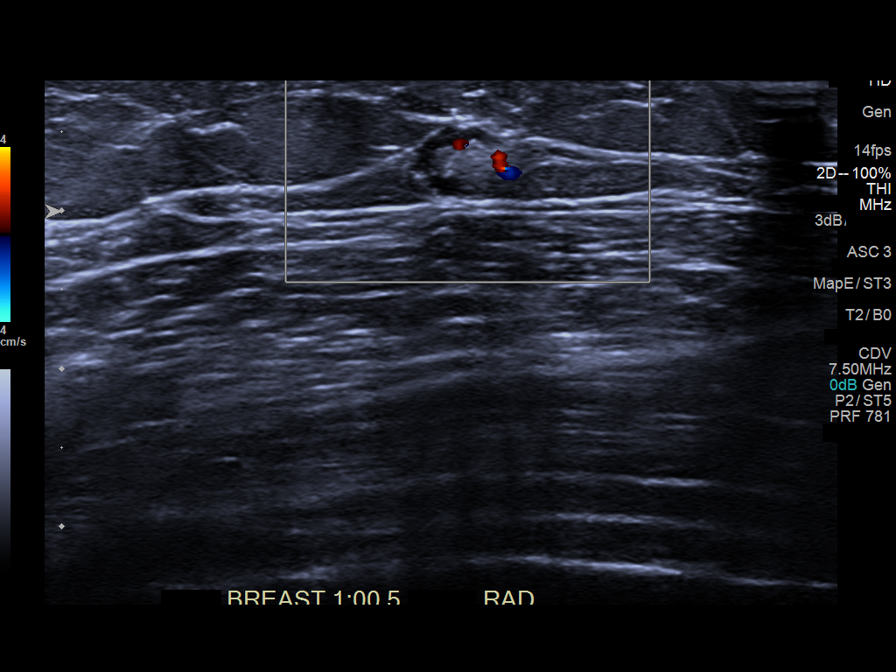
[im 8/9]
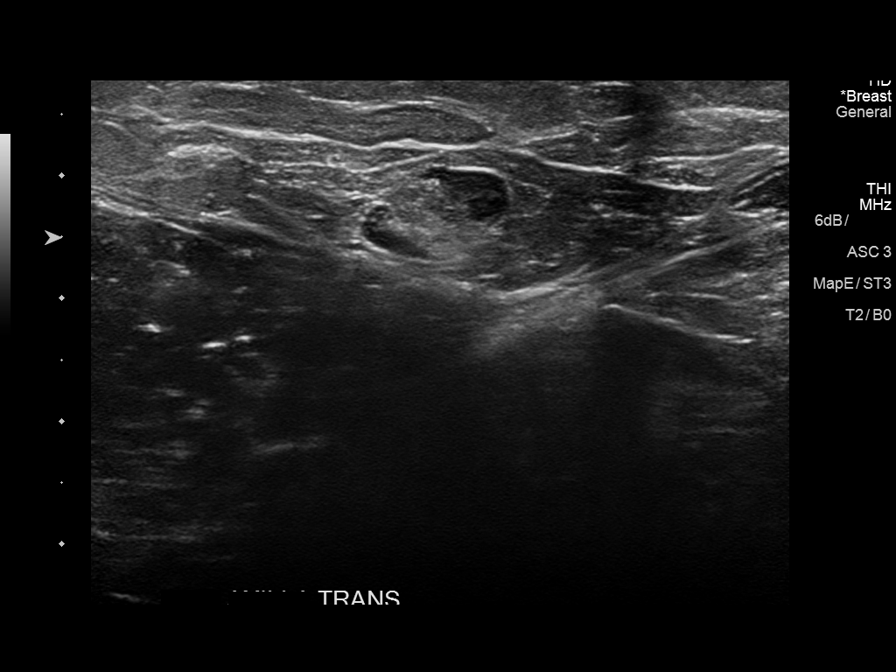
[im 9/9]
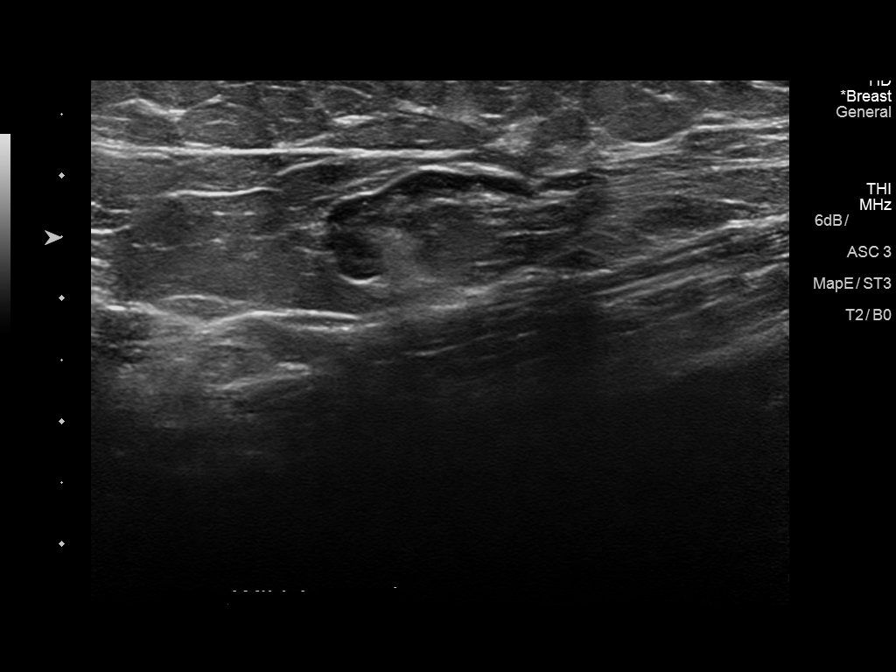

[9 of 9 positions shown; findings below may reference images not displayed]

FINDINGS: BB marks area of patient's concern in the upper-outer quadrant of
the left breast. Adjacent to this location, there is a small
circumscribed oval mass with lucent center consistent with benign
intramammary lymph node. There are visible lymph nodes in the left
axilla, further evaluated sonographically. The right breast is
negative.

Mammographic images were processed with CAD.

On physical exam, I palpate no discrete mass in the upper-outer
quadrant of the left breast. The patient is not at able to palpate
the abnormality today.

Targeted ultrasound is performed, showing no abnormality in the area
marked by the patient. However, approximately 2 cm away in the 1
o'clock location of the left breast 5 cm from the nipple, there is a
hypoechoic mass with circumscribed hyperechoic center which measures
0.8 cm in diameter. Findings are consistent with benign intramammary
lymph node. In the left axilla, normal morphology lymph nodes are
imaged. No suspicious lymph nodes or other mass identified.
IMPRESSION: 1.  No mammographic or ultrasound evidence for malignancy.
2. Palpable abnormality is in the region of a small benign
intramammary lymph node.
3. No adenopathy by ultrasound.

RECOMMENDATION:
Clinical followup is recommended as needed.

I have discussed the findings and recommendations with the patient.
Results were also provided in writing at the conclusion of the
visit. If applicable, a reminder letter will be sent to the patient
regarding the next appointment.

BI-RADS CATEGORY  2: Benign.

## 2019-08-29 ENCOUNTER — Ambulatory Visit: Payer: Self-pay | Admitting: *Deleted

## 2019-08-29 NOTE — Telephone Encounter (Signed)
Patient is having off/on chest pain. Possible flutter at times- he feels he may have to take deeper breathes when occurring. Patient states he has had 2 episodes today. Advised ED for evaluation per protocol.  Reason for Disposition . [1] Chest pain (or "angina") comes and goes AND [2] is happening more often (increasing in frequency) or getting worse (increasing in severity) (Exception: chest pains that last only a few seconds)  Answer Assessment - Initial Assessment Questions 1. LOCATION: "Where does it hurt?"       Left side above heart 2. RADIATION: "Does the pain go anywhere else?" (e.g., into neck, jaw, arms, back)     Local- possible arm pit area 3. ONSET: "When did the chest pain begin?" (Minutes, hours or days)      Off/on- last felt 2 pm today- and this morning 4. PATTERN "Does the pain come and go, or has it been constant since it started?"  "Does it get worse with exertion?"      Comes and goes, a little bit- notices more when active 5. DURATION: "How long does it last" (e.g., seconds, minutes, hours)     1-2 minutes 6. SEVERITY: "How bad is the pain?"  (e.g., Scale 1-10; mild, moderate, or severe)    - MILD (1-3): doesn't interfere with normal activities     - MODERATE (4-7): interferes with normal activities or awakens from sleep    - SEVERE (8-10): excruciating pain, unable to do any normal activities       3-4- mild 7. CARDIAC RISK FACTORS: "Do you have any history of heart problems or risk factors for heart disease?" (e.g., angina, prior heart attack; diabetes, high blood pressure, high cholesterol, smoker, or strong family history of heart disease)     Hx of irregular heartbeat, high lipids 8. PULMONARY RISK FACTORS: "Do you have any history of lung disease?"  (e.g., blood clots in lung, asthma, emphysema, birth control pills)     no 9. CAUSE: "What do you think is causing the chest pain?"     No 10. OTHER SYMPTOMS: "Do you have any other symptoms?" (e.g., dizziness,  nausea, vomiting, sweating, fever, difficulty breathing, cough)       Dizziness while squatting, SIB occurred with pain and stairs 11. PREGNANCY: "Is there any chance you are pregnant?" "When was your last menstrual period?"       n/a  Protocols used: CHEST PAIN-A-AH

## 2019-10-25 ENCOUNTER — Telehealth (INDEPENDENT_AMBULATORY_CARE_PROVIDER_SITE_OTHER): Admitting: Family Medicine

## 2019-10-25 ENCOUNTER — Encounter: Payer: Self-pay | Admitting: Family Medicine

## 2019-10-25 ENCOUNTER — Other Ambulatory Visit: Payer: Self-pay

## 2019-10-25 DIAGNOSIS — R7989 Other specified abnormal findings of blood chemistry: Secondary | ICD-10-CM

## 2019-10-25 DIAGNOSIS — A6002 Herpesviral infection of other male genital organs: Secondary | ICD-10-CM

## 2019-10-25 DIAGNOSIS — Z1159 Encounter for screening for other viral diseases: Secondary | ICD-10-CM

## 2019-10-25 DIAGNOSIS — E782 Mixed hyperlipidemia: Secondary | ICD-10-CM

## 2019-10-25 MED ORDER — VALACYCLOVIR HCL 500 MG PO TABS: 500.0000 mg | ORAL_TABLET | Freq: Every day | ORAL | 3 refills | Status: DC

## 2019-10-25 NOTE — Assessment & Plan Note (Signed)
Uncontrolled cholesterol, last results 06/2019 not available but reported elevated Prior lipid 2018 elevated Limited ASCVD risk data at age 29 Lifestyle contributing factor also may have genetic/familial component  Plan: 1. Not on medication 2. Check labs in 3 months with lifestyle improvement - return 01/17/20 for fasting lipid, CMET 3. Encourage improved lifestyle - low carb/cholesterol, reduce portion size, continue improving regular exercise

## 2019-10-25 NOTE — Assessment & Plan Note (Signed)
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 

## 2019-10-25 NOTE — Patient Instructions (Addendum)
Thank you for coming to the office today.  Try to get the other cholesterol result, send to me on mychart  Improve diet to limit cholesterol intake, avoid fried fatty foods and check food labels.  Keep up exercising.  No cholesterol medicine until age 29+ there could be a genetic/familial component.  Likely liver enzymes related to cholesterol   DUE for FASTING BLOOD WORK (no food or drink after midnight before the lab appointment, only water or coffee without cream/sugar on the morning of)  SCHEDULE "Lab Only" visit in the morning at the clinic for lab draw in 2-3 MONTHS   - Make sure Lab Only appointment is at about 1 week before your next appointment, so that results will be available  For Lab Results, once available within 2-3 days of blood draw, you can can log in to MyChart online to view your results and a brief explanation. Also, we can discuss results at next follow-up visit.     Please schedule a Follow-up Appointment to: Return in about 3 months (around 01/24/2020) for 3 month fasting lab only then 1 week later MyChart Video Visit Cholesterol results.  If you have any other questions or concerns, please feel free to call the office or send a message through MyChart. You may also schedule an earlier appointment if necessary.  Additionally, you may be receiving a survey about your experience at our office within a few days to 1 week by e-mail or mail. We value your feedback.  Saralyn Pilar, DO [facility] [place], [place]

## 2019-10-25 NOTE — Progress Notes (Signed)
Virtual Visit via Telephone The purpose of this virtual visit is to provide medical care while limiting exposure to the novel coronavirus (COVID19) for both patient and office staff.  Consent was obtained for phone visit:  Yes.   Answered questions that patient had about telehealth interaction:  Yes.   I discussed the limitations, risks, security and privacy concerns of performing an evaluation and management service by telephone. I also discussed with the patient that there may be a patient responsible charge related to this service. The patient expressed understanding and agreed to proceed.  Patient Location: Home Provider Location: [facility])  ---------------------------------------------------------------------- Chief Complaint  Patient presents with  . Hyperlipidemia    lab work was done for life insurance in 06/2019--can't find those result but had shown abnormal lipid and hepatic function test    S: Reviewed CMA documentation. I have called patient and gathered additional HPI as follows:  HYPERLIPIDEMIA / Elevated LFT - Reports concerns with labs done through life insurance biometric 06/2019, he does not have results but recalls he had elevated cholesterol and elevated liver enzyme - Last lipid panel on file we have is 2018, showed elevated total cholesterol and low HDL and elevated LDL 146 - Not on cholesterol medicine Lifestyle - Diet: recently not adhering to regular diet - Exercise: Exercise weight lifting 2-3 times a week  Genital Herpes ,recurrent  Request refill Acyclovir 500mg  daily suppression dose.  Denies any known or suspected exposure to person with or possibly with COVID19.  Denies any fevers, chills, sweats, body ache, cough, shortness of breath, sinus pain or pressure, headache, abdominal pain, diarrhea  No past medical history on file. Social History   Tobacco Use  . Smoking status: Never Smoker  . Smokeless tobacco: Never Used   Vaping Use  . Vaping Use: Never used  Substance Use Topics  . Alcohol use: Yes    Alcohol/week: 3.0 standard drinks    Types: 3 Cans of beer per week    Comment: rarely, occasional  . Drug use: No    Current Outpatient Medications:  .  valACYclovir (VALTREX) 500 MG tablet, Take 1 tablet (500 mg total) by mouth daily., Disp: 90 tablet, Rfl: 3  Depression screen [facility] 2/9 10/25/2019 03/22/2018 08/31/2016  Decreased Interest 0 0 0  Down, Depressed, Hopeless 0 0 0  PHQ - 2 Score 0 0 0  Altered sleeping - - 0  Tired, decreased energy - - 0  Change in appetite - - 0  Feeling bad or failure about yourself  - - 0  Trouble concentrating - - 0  Moving slowly or fidgety/restless - - 0  Suicidal thoughts - - 0  PHQ-9 Score - - 0    GAD 7 : Generalized Anxiety Score 08/31/2016  Nervous, Anxious, on Edge 0  Control/stop worrying 0  Worry too much - different things 0  Trouble relaxing 0  Restless 0  Easily annoyed or irritable 0  Afraid - awful might happen 0  Total GAD 7 Score 0  Anxiety Difficulty Not difficult at all    -------------------------------------------------------------------------- O: No physical exam performed due to remote telephone encounter.  Lab results reviewed.  No results found for this or any previous visit (from the past 2160 hour(s)).  -------------------------------------------------------------------------- A&P:  Problem List Items Addressed This Visit    Hyperlipidemia - Primary    Uncontrolled cholesterol, last results 06/2019 not available but reported elevated Prior lipid 2018 elevated Limited ASCVD risk data at age 8  Lifestyle contributing factor also may have genetic/familial component  Plan: 1. Not on medication 2. Check labs in 3 months with lifestyle improvement - return 01/17/20 for fasting lipid, CMET 3. 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 valACYclovir (VALTREX) 500 MG tablet    Sig: Take 1 tablet (500 mg total) by mouth daily.    Dispense:  90 tablet    Refill:  3    Follow-up: - Return in 3 months for virtual visit to review labs - Future labs ordered for CMET, Lipid, Hep C - 01/17/20  Patient verbalizes understanding with the above medical recommendations including the limitation of remote medical advice.  Specific follow-up and call-back criteria were given for patient to follow-up or seek medical care more urgently if needed.   - Time spent in direct consultation with patient on phone: 15 minutes   14/1/21, DO [facility] 10/25/2019, 8:46 AM

## 2020-01-17 ENCOUNTER — Other Ambulatory Visit: Payer: Self-pay

## 2020-01-17 ENCOUNTER — Other Ambulatory Visit

## 2020-01-17 DIAGNOSIS — Z1159 Encounter for screening for other viral diseases: Secondary | ICD-10-CM

## 2020-01-17 DIAGNOSIS — R7989 Other specified abnormal findings of blood chemistry: Secondary | ICD-10-CM

## 2020-01-17 DIAGNOSIS — E782 Mixed hyperlipidemia: Secondary | ICD-10-CM

## 2020-01-18 LAB — COMPLETE METABOLIC PANEL WITH GFR
AG Ratio: 2.1 (calc) (ref 1.0–2.5)
ALT: 80 U/L — ABNORMAL HIGH (ref 9–46)
AST: 38 U/L (ref 10–40)
Albumin: 4.8 g/dL (ref 3.6–5.1)
Alkaline phosphatase (APISO): 92 U/L (ref 36–130)
BUN/Creatinine Ratio: 12 (calc) (ref 6–22)
BUN: 16 mg/dL (ref 7–25)
CO2: 29 mmol/L (ref 20–32)
Calcium: 10.7 mg/dL — ABNORMAL HIGH (ref 8.6–10.3)
Chloride: 103 mmol/L (ref 98–110)
Creat: 1.39 mg/dL — ABNORMAL HIGH (ref 0.60–1.35)
GFR, Est African American: 79 mL/min/{1.73_m2} (ref >=60)
GFR, Est Non African American: 68 mL/min/{1.73_m2} (ref >=60)
Globulin: 2.3 g/dL (calc) (ref 1.9–3.7)
Glucose, Bld: 120 mg/dL — ABNORMAL HIGH (ref 65–99)
Potassium: 4.8 mmol/L (ref 3.5–5.3)
Sodium: 141 mmol/L (ref 135–146)
Total Bilirubin: 0.5 mg/dL (ref 0.2–1.2)
Total Protein: 7.1 g/dL (ref 6.1–8.1)

## 2020-01-18 LAB — HEPATITIS C ANTIBODY
Hepatitis C Ab: NONREACTIVE
SIGNAL TO CUT-OFF: 0.01 (ref <1.00)

## 2020-01-18 LAB — LIPID PANEL
Cholesterol: 180 mg/dL (ref <200)
HDL: 27 mg/dL — ABNORMAL LOW (ref >=40)
LDL Cholesterol (Calc): 109 mg/dL (calc) — ABNORMAL HIGH
Non-HDL Cholesterol (Calc): 153 mg/dL (calc) — ABNORMAL HIGH (ref <130)
Total CHOL/HDL Ratio: 6.7 (calc) — ABNORMAL HIGH (ref <5.0)
Triglycerides: 327 mg/dL — ABNORMAL HIGH (ref <150)

## 2020-01-19 NOTE — Telephone Encounter (Signed)
Responded via labs

## 2020-01-24 ENCOUNTER — Other Ambulatory Visit: Payer: Self-pay

## 2020-01-24 ENCOUNTER — Telehealth (INDEPENDENT_AMBULATORY_CARE_PROVIDER_SITE_OTHER): Admitting: Family Medicine

## 2020-01-24 ENCOUNTER — Encounter: Payer: Self-pay | Admitting: Family Medicine

## 2020-01-24 DIAGNOSIS — E782 Mixed hyperlipidemia: Secondary | ICD-10-CM

## 2020-01-24 DIAGNOSIS — R7989 Other specified abnormal findings of blood chemistry: Secondary | ICD-10-CM

## 2020-01-24 NOTE — Progress Notes (Signed)
Subjective:    Patient ID: Devin Curtis, male    DOB: 1990-12-21, 29 y.o.   MRN: [id number]  Devin Curtis is a 29 y.o. male presenting on 01/24/2020 for Hyperlipidemia (B/P on Sunday was 131/78mm/hg  89 bpm)  Virtual / Telehealth Encounter - Video Visit via MyChart The purpose of this virtual visit is to provide medical care while limiting exposure to the novel coronavirus (COVID19) for both patient and office staff.  Consent was obtained for remote visit:  Yes.   Answered questions that patient had about telehealth interaction:  Yes.   I discussed the limitations, risks, security and privacy concerns of performing an evaluation and management service by video/telephone. I also discussed with the patient that there may be a patient responsible charge related to this service. The patient expressed understanding and agreed to proceed.  Patient Location: Home Provider Location: [facility] (Office)  Participants in virtual visit: - Patient: Devin Curtis - CMA: Elvina Mattes, CMA - Provider: Dr Althea Charon   HPI   HYPERLIPIDEMIA / Elevated LFT - Reports concerns with labs done through life insurance biometric 06/2019, he does not have results but recalls he had elevated cholesterol and elevated liver enzyme  Last visit with me 10/2019 virtual, these problems were discussed and he worked on improved lifestyle  [facility] and has initiated plant based diet and improved hydration  Last labs done 01/17/20 showed elevated Creatinine 1.39 and Calcium 10.7 and mixed cholesterol result. Improved total cholesterol to 180 (from 206) and LDL to 109 (from 146 - prior results 2018). He had elevated Triglycerides though at 327. This was fasting.  Today he reports confidence with new diet plan. And he will improve hydration. - Not on cholesterol medicine  Elevated ALT liver enzyme at 80. No comparison, recently - last lab 2018 was normal at 26  Now he has  stopped alcohol intake on weekends.  - Diet: recently not adhering to regular diet - Exercise: Exercise weight lifting 2-3 times a week    Depression screen [facility] 2/9 10/25/2019 03/22/2018 08/31/2016  Decreased Interest 0 0 0  Down, Depressed, Hopeless 0 0 0  PHQ - 2 Score 0 0 0  Altered sleeping - - 0  Tired, decreased energy - - 0  Change in appetite - - 0  Feeling bad or failure about yourself  - - 0  Trouble concentrating - - 0  Moving slowly or fidgety/restless - - 0  Suicidal thoughts - - 0  PHQ-9 Score - - 0    Social History   Tobacco Use  . Smoking status: Never Smoker  . Smokeless tobacco: Never Used  Vaping Use  . Vaping Use: Never used  Substance Use Topics  . Alcohol use: Yes    Alcohol/week: 3.0 standard drinks    Types: 3 Cans of beer per week    Comment: rarely, occasional  . Drug use: No    Review of Systems Per HPI unless specifically indicated above     Objective:    There were no vitals taken for this visit.  Wt Readings from Last 3 Encounters:  03/22/18 204 lb (92.5 kg)  10/12/16 200 lb 8 oz (90.9 kg)  08/31/16 193 lb (87.5 kg)    Physical Exam   Note examination was completely remotely via video observation objective data only  Gen - well-appearing, no acute distress or apparent pain, comfortable HEENT - eyes appear clear without discharge or redness  Heart/Lungs - cannot examine virtually - observed no evidence of coughing or labored breathing. Abd - cannot examine virtually  Skin - face visible today- no rash Neuro - awake, alert, oriented Psych - not anxious appearing   Results for orders placed or performed in visit on 01/17/20  Hepatitis C antibody  Result Value Ref Range   Hepatitis C Ab NON-REACTIVE NON-REACTI   SIGNAL TO CUT-OFF 0.01 <1.00  Lipid panel  Result Value Ref Range   Cholesterol 180 <200 mg/dL   HDL 27 (L) > OR = 40 mg/dL   Triglycerides 154 (H) <150 mg/dL   LDL Cholesterol (Calc) 109 (H) mg/dL (calc)   Total  CHOL/HDL Ratio 6.7 (H) <5.0 (calc)   Non-HDL Cholesterol (Calc) 153 (H) <130 mg/dL (calc)  COMPLETE METABOLIC PANEL WITH GFR  Result Value Ref Range   Glucose, Bld 120 (H) 65 - 99 mg/dL   BUN 16 7 - 25 mg/dL   Creat 0.08 (H) 6.76 - 1.35 mg/dL   GFR, Est Non African American 68 > OR = 60 mL/min/1.60m2   GFR, Est African American 79 > OR = 60 mL/min/1.67m2   BUN/Creatinine Ratio 12 6 - 22 (calc)   Sodium 141 135 - 146 mmol/L   Potassium 4.8 3.5 - 5.3 mmol/L   Chloride 103 98 - 110 mmol/L   CO2 29 20 - 32 mmol/L   Calcium 10.7 (H) 8.6 - 10.3 mg/dL   Total Protein 7.1 6.1 - 8.1 g/dL   Albumin 4.8 3.6 - 5.1 g/dL   Globulin 2.3 1.9 - 3.7 g/dL (calc)   AG Ratio 2.1 1.0 - 2.5 (calc)   Total Bilirubin 0.5 0.2 - 1.2 mg/dL   Alkaline phosphatase (APISO) 92 36 - 130 U/L   AST 38 10 - 40 U/L   ALT 80 (H) 9 - 46 U/L      Assessment & Plan:   Problem List Items Addressed This Visit    Hyperlipidemia - Primary    Still mixed cholesterol result Elevated TG >327, total and LDL improved Prior lipid 2018 and 06/2019 elevated Limited ASCVD risk data at age 62 Lifestyle contributing factor also may have genetic/familial component  Plan: 1. Not on medication - discussed fish oil may be option for his hyperTG. However first try plant based diet 2. Encourage improved lifestyle - low carb/cholesterol, reduce portion size, continue improving regular exercise  Return 6 month for fasting labs again      Relevant Orders   CBC with Differential/Platelet   COMPLETE METABOLIC PANEL WITH GFR   Lipid panel    Other Visit Diagnoses    Elevated LFTs       Relevant Orders   COMPLETE METABOLIC PANEL WITH GFR   Elevated serum creatinine       Relevant Orders   COMPLETE METABOLIC PANEL WITH GFR      #Elevated Cr 1.39, elevated likely with reduced hydration prior to lab Will improve hydration and repeat  #Elevated ALT 80 on lab result, prior normal 2018, was elevated as well 06/2019 but not have  this result available. Encouraged that treating cholesterol with diet will help this Stop alcohol Improve hydration Follow-up labs  No orders of the defined types were placed in this encounter.     Follow up plan: Return in about 6 months (around 07/24/2020) for 6 month fasting lab only then 1 week later follow-up results cholesterol.   Patient verbalizes understanding with the above medical recommendations including the limitation of remote medical advice.  Specific follow-up and call-back criteria were given for patient to follow-up or seek medical care more urgently if needed.  Total duration of direct patient care provided via video conference: 10 minutes   Future labs ordered for 07/2020  Saralyn Pilar, DO [facility] 01/24/2020, 4:04 PM

## 2020-01-24 NOTE — Assessment & Plan Note (Signed)
Still mixed cholesterol result Elevated TG >327, total and LDL improved Prior lipid 2018 and 06/2019 elevated Limited ASCVD risk data at age 29 Lifestyle contributing factor also may have genetic/familial component  Plan: 1. Not on medication - discussed fish oil may be option for his hyperTG. However first try plant based diet 2. Encourage improved lifestyle - low carb/cholesterol, reduce portion size, continue improving regular exercise  Return 6 month for fasting labs again

## 2020-12-25 ENCOUNTER — Other Ambulatory Visit: Payer: Self-pay | Admitting: Family Medicine

## 2020-12-25 DIAGNOSIS — A6002 Herpesviral infection of other male genital organs: Secondary | ICD-10-CM

## 2020-12-25 NOTE — Telephone Encounter (Signed)
Patient will need an office visit for further refills. Requested Prescriptions  Pending Prescriptions Disp Refills  . valACYclovir (VALTREX) 500 MG tablet [Pharmacy Med Name: VALACYCLOVIR 500MG  TABLETS] 90 tablet 0    Sig: TAKE 1 TABLET(500 MG) BY MOUTH DAILY     Antimicrobials:  Antiviral Agents - Anti-Herpetic Passed - 12/25/2020 10:08 PM      Passed - Valid encounter within last 12 months    Recent Outpatient Visits          11 months ago Mixed hyperlipidemia   [facility] [place], [place], [place]   1 year ago Mixed hyperlipidemia   [facility] [place], [place], DO   2 years ago Suspected sleep apnea   [facility] [place], [place], DO   4 years ago Fatigue, unspecified type   [facility] [place], [place], DO   4 years ago Gynecomastia, male   [facility] [place], [place], [place] Neat

## 2021-05-01 ENCOUNTER — Other Ambulatory Visit: Payer: Self-pay | Admitting: Family Medicine

## 2021-05-01 DIAGNOSIS — A6002 Herpesviral infection of other male genital organs: Secondary | ICD-10-CM

## 2021-05-05 ENCOUNTER — Encounter: Payer: Self-pay | Admitting: Family Medicine

## 2021-05-05 ENCOUNTER — Other Ambulatory Visit: Payer: Self-pay

## 2021-05-05 ENCOUNTER — Telehealth (INDEPENDENT_AMBULATORY_CARE_PROVIDER_SITE_OTHER): Admitting: Family Medicine

## 2021-05-05 VITALS — Ht 71.0 in | Wt 204.0 lb

## 2021-05-05 DIAGNOSIS — Z9989 Dependence on other enabling machines and devices: Secondary | ICD-10-CM

## 2021-05-05 DIAGNOSIS — R7309 Other abnormal glucose: Secondary | ICD-10-CM

## 2021-05-05 DIAGNOSIS — R7989 Other specified abnormal findings of blood chemistry: Secondary | ICD-10-CM | POA: Diagnosis not present

## 2021-05-05 DIAGNOSIS — G4733 Obstructive sleep apnea (adult) (pediatric): Secondary | ICD-10-CM

## 2021-05-05 DIAGNOSIS — A6002 Herpesviral infection of other male genital organs: Secondary | ICD-10-CM

## 2021-05-05 DIAGNOSIS — E782 Mixed hyperlipidemia: Secondary | ICD-10-CM

## 2021-05-05 MED ORDER — VALACYCLOVIR HCL 500 MG PO TABS: 500.0000 mg | ORAL_TABLET | Freq: Every day | ORAL | 3 refills | Status: DC

## 2021-05-05 NOTE — Progress Notes (Signed)
? ?Acute Office Visit ? ?Subjective:  ? ? Patient ID: Devin Curtis, male    DOB: 05/28/1990, 31 y.o.   MRN: [id number] ? ?Chief Complaint  ?Patient presents with  ? genital herpes  ? ?Virtual / Telehealth Encounter - Video Visit via MyChart ?The purpose of this virtual visit is to provide medical care while limiting exposure to the novel coronavirus (COVID19) for both patient and office staff. ? ?Consent was obtained for remote visit:  Yes.   ?Answered questions that patient had about telehealth interaction:  Yes.   ?I discussed the limitations, risks, security and privacy concerns of performing an evaluation and management service by video/telephone. I also discussed with the patient that there may be a patient responsible charge related to this service. The patient expressed understanding and agreed to proceed. ? ?Patient Location: Home ?Provider Location: [facility] (Office) ? ?Participants in virtual visit: ?- Patient: Donavan Burnet ?- CMA: Orinda Kenner, CMA ?- Provider: Dr Parks Ranger ? ?Last visit 2021 ? ?HPI ? ?HSV2 genital recurrent ?Controlled on valtrex 500mg  daily for suppression, request new order today. Not having flares or breakthrough symptoms ?Needs new order. ? ?OSA on CPAP ?Last PSG done 04/2018 diagnosed sleep apnea and he was ordered for CPAP machine ? ?- Today reports that sleep apnea is well controlled. He uses the CPAP machine every night. Tolerates the machine well, and thinks that sleeps better with it and feels good. No new concerns or symptoms. ?He will need updated orders for new CPAP supplies this year. ?He uses [facility]. ? ?HYPERLIPIDEMIA: ?- Reports no concerns. Last lipid panel 2021, elevated ?Not on medication ?Due for labs ? ?Elevated LFT ?Due for repeat lab. ? ? ?History reviewed. No pertinent past medical history. ? ?Past Surgical History:  ?Procedure Laterality Date  ? FOOT SURGERY  04/10/2015  ? [place] EXTRACTION  2014  ? ? ?Family  History  ?Problem Relation Age of Onset  ? Cancer Mother 19  ?     ovarian (47) / breast cancer (55)  ? Ovarian cancer Mother 81  ? Breast cancer Mother 21  ?     remission  ? Other Mother   ?     Pre-Diabetes  ? Heart disease Father   ? Heart attack Father 71  ? ? ?Social History  ? ?Socioeconomic History  ? Marital status: Married  ?  Spouse name: Not on file  ? Number of children: 2  ? Years of education: College  ? Highest education level: Not on file  ?Occupational History  ? 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  Comment: 1-2 year program  ? Occupation: Retired Corporate treasurer (2018)  ?  Comment: Medical leave after Foot Surgery  ?Tobacco Use  ? Smoking status: Never  ? Smokeless tobacco: Never  ?Vaping Use  ? Vaping Use: Never used  ?Substance and Sexual Activity  ? Alcohol use: Yes  ?  Alcohol/week: 3.0 standard drinks  ?  Types: 3 Cans of beer per week  ?  Comment: rarely, occasional  ? Drug use: No  ? Sexual activity: Yes  ?Other Topics Concern  ? Not on file  ?Social History Narrative  ? Not on file  ? ?Social Determinants of Health  ? ?Financial Resource Strain: Not on file  ?Food Insecurity: Not on file  ?Transportation Needs: Not on file  ?Physical Activity: Not on file  ?Stress: Not on file  ?Social Connections: Not on file  ?Intimate  Partner Violence: Not on file  ? ? ?Outpatient Medications Prior to Visit  ?Medication Sig Dispense Refill  ? valACYclovir (VALTREX) 500 MG tablet TAKE 1 TABLET(500 MG) BY MOUTH DAILY 90 tablet 0  ? ?No facility-administered medications prior to visit.  ? ? ?Allergies  ?Allergen Reactions  ? Penicillins Hives  ? ? ?Review of Systems ? ?   ?Objective:  ?  ?Physical Exam ? ?Note examination was completely remotely via video observation objective data only ? ?Gen - well-appearing, no acute distress or apparent pain, comfortable ?HEENT - eyes appear clear without discharge or redness ?Heart/Lungs - cannot examine virtually - observed no evidence of coughing or  labored breathing. ?Abd - cannot examine virtually  ?Skin - face visible today- no rash ?Neuro - awake, alert, oriented ?Psych - not anxious appearing ? ? ?Ht 5\' 11"  (1.803 m)   Wt 204 lb (92.5 kg)   BMI 28.45 kg/m?  ?Wt Readings from Last 3 Encounters:  ?05/05/21 204 lb (92.5 kg)  ?03/22/18 204 lb (92.5 kg)  ?10/12/16 200 lb 8 oz (90.9 kg)  ? ? ?Health Maintenance Due  ?Topic Date Due  ? COVID-19 Vaccine (1) Never done  ? INFLUENZA VACCINE  09/16/2020  ? ? ?There are no preventive care reminders to display for this patient. ? ? ?Lab Results  ?Component Value Date  ? TSH 0.99 09/09/2016  ? ?Lab Results  ?Component Value Date  ? WBC 13.8 (H) 09/09/2016  ? HGB 16.0 09/09/2016  ? HCT 46.7 09/09/2016  ? MCV 85.5 09/09/2016  ? PLT 279 09/09/2016  ? ?Lab Results  ?Component Value Date  ? NA 141 01/17/2020  ? K 4.8 01/17/2020  ? CO2 29 01/17/2020  ? GLUCOSE 120 (H) 01/17/2020  ? BUN 16 01/17/2020  ? CREATININE 1.39 (H) 01/17/2020  ? BILITOT 0.5 01/17/2020  ? ALKPHOS 83 09/09/2016  ? AST 38 01/17/2020  ? ALT 80 (H) 01/17/2020  ? PROT 7.1 01/17/2020  ? ALBUMIN 4.5 09/09/2016  ? ALBUMIN 4.5 09/09/2016  ? CALCIUM 10.7 (H) 01/17/2020  ? ?Lab Results  ?Component Value Date  ? CHOL 180 01/17/2020  ? ?Lab Results  ?Component Value Date  ? HDL 27 (L) 01/17/2020  ? ?Lab Results  ?Component Value Date  ? LDLCALC 109 (H) 01/17/2020  ? ?Lab Results  ?Component Value Date  ? TRIG 327 (H) 01/17/2020  ? ?Lab Results  ?Component Value Date  ? CHOLHDL 6.7 (H) 01/17/2020  ? ?Lab Results  ?Component Value Date  ? HGBA1C 4.9 09/09/2016  ? ? ?   ?Assessment & Plan:  ? ?Problem List Items Addressed This Visit   ? ? OSA on CPAP - Primary  ? Hyperlipidemia  ? Relevant Orders  ? COMPLETE METABOLIC PANEL WITH GFR  ? CBC with Differential/Platelet  ? Lipid panel  ? Herpes genitalis  ? Relevant Medications  ? valACYclovir (VALTREX) 500 MG tablet  ? ?Other Visit Diagnoses   ? ? Elevated LFTs      ? Relevant Orders  ? COMPLETE METABOLIC PANEL WITH GFR   ? Abnormal glucose      ? Relevant Orders  ? Hemoglobin A1c  ? ?  ? ?OSA on CPAP ?Well controlled, chronic OSA on CPAP, now >3 years ?- Good adherence to CPAP nightly ?- Continue current CPAP therapy, patient seems to be benefiting from therapy ?- He will need new CPAP supplies ordered through [facility], once we receive order form. ?- If not within next 1-2  weeks, we can request an order form ? ?HSV2, genital recurrent ?Controlled on suppression therapy Valtrex 500mg  daily, re order ? ?Elevated LFTs ?Repeat lab CMET ? ?HLD ?Repeat LIpids upcoming, now improved lifestyle ? ?A1c due ? ? ?Orders Placed This Encounter  ?Procedures  ? COMPLETE METABOLIC PANEL WITH GFR  ?  Standing Status:   Future  ?  Standing Expiration Date:   01/16/2022  ? CBC with Differential/Platelet  ?  Standing Status:   Future  ?  Standing Expiration Date:   01/16/2022  ? Lipid panel  ?  Standing Status:   Future  ?  Standing Expiration Date:   01/16/2022  ?  Order Specific Question:   Has the patient fasted?  ?  Answer:   Yes  ? Hemoglobin A1c  ?  Standing Status:   Future  ?  Standing Expiration Date:   01/16/2022  ? ? ? ?Meds ordered this encounter  ?Medications  ? valACYclovir (VALTREX) 500 MG tablet  ?  Sig: Take 1 tablet (500 mg total) by mouth daily.  ?  Dispense:  90 tablet  ?  Refill:  3  ? ? ?Follow-up: ?- Return in 1 month or less for labs ?- Future labs ordered  ? ?Patient verbalizes understanding with the above medical recommendations including the limitation of remote medical advice. ? ?Specific follow-up and call-back criteria were given for patient to follow-up or seek medical care more urgently if needed. ? ?Total duration of direct patient care provided via video conference: 8 minutes ? ? ?Nobie Putnam, DO ?[facility] ?[facility] ?05/05/2021, 4:44 PM ? ? ?

## 2021-05-05 NOTE — Patient Instructions (Addendum)
Valtrex re ordered ?Labs ordered ?Will look for orders from [facility] on CPAP supplies, if we don't receive it, perhaps could you contact them and we can do the same if need. ? ?Please schedule a Follow-up Appointment to: Return if symptoms worsen or fail to improve. ? ?If you have any other questions or concerns, please feel free to call the office or send a message through MyChart. You may also schedule an earlier appointment if necessary. ? ?Additionally, you may be receiving a survey about your experience at our office within a few days to 1 week by e-mail or mail. We value your feedback. ? ?Saralyn Pilar, DO ?[facility], [place] ?

## 2021-08-13 ENCOUNTER — Other Ambulatory Visit

## 2021-08-13 DIAGNOSIS — R7989 Other specified abnormal findings of blood chemistry: Secondary | ICD-10-CM

## 2021-08-13 DIAGNOSIS — R7309 Other abnormal glucose: Secondary | ICD-10-CM

## 2021-08-13 DIAGNOSIS — E782 Mixed hyperlipidemia: Secondary | ICD-10-CM

## 2021-08-14 LAB — CBC WITH DIFFERENTIAL/PLATELET
Absolute Monocytes: 428 cells/uL (ref 200–950)
Basophils Absolute: 50 cells/uL (ref 0–200)
Basophils Relative: 0.8 %
Eosinophils Absolute: 107 cells/uL (ref 15–500)
Eosinophils Relative: 1.7 %
HCT: 42.8 % (ref 38.5–50.0)
Hemoglobin: 14.6 g/dL (ref 13.2–17.1)
Lymphs Abs: 1789 cells/uL (ref 850–3900)
MCH: 29.4 pg (ref 27.0–33.0)
MCHC: 34.1 g/dL (ref 32.0–36.0)
MCV: 86.3 fL (ref 80.0–100.0)
MPV: 10.7 fL (ref 7.5–12.5)
Monocytes Relative: 6.8 %
Neutro Abs: 3925 cells/uL (ref 1500–7800)
Neutrophils Relative %: 62.3 %
Platelets: 204 10*3/uL (ref 140–400)
RBC: 4.96 10*6/uL (ref 4.20–5.80)
RDW: 13 % (ref 11.0–15.0)
Total Lymphocyte: 28.4 %
WBC: 6.3 10*3/uL (ref 3.8–10.8)

## 2021-08-14 LAB — COMPLETE METABOLIC PANEL WITH GFR
AG Ratio: 1.8 (calc) (ref 1.0–2.5)
ALT: 54 U/L — ABNORMAL HIGH (ref 9–46)
AST: 25 U/L (ref 10–40)
Albumin: 4.5 g/dL (ref 3.6–5.1)
Alkaline phosphatase (APISO): 88 U/L (ref 36–130)
BUN: 14 mg/dL (ref 7–25)
CO2: 29 mmol/L (ref 20–32)
Calcium: 9.6 mg/dL (ref 8.6–10.3)
Chloride: 104 mmol/L (ref 98–110)
Creat: 1.26 mg/dL (ref 0.60–1.26)
Globulin: 2.5 g/dL (calc) (ref 1.9–3.7)
Glucose, Bld: 111 mg/dL — ABNORMAL HIGH (ref 65–99)
Potassium: 4.2 mmol/L (ref 3.5–5.3)
Sodium: 141 mmol/L (ref 135–146)
Total Bilirubin: 0.7 mg/dL (ref 0.2–1.2)
Total Protein: 7 g/dL (ref 6.1–8.1)
eGFR: 79 mL/min/{1.73_m2} (ref >=60)

## 2021-08-14 LAB — LIPID PANEL
Cholesterol: 175 mg/dL (ref <200)
HDL: 32 mg/dL — ABNORMAL LOW (ref >=40)
LDL Cholesterol (Calc): 114 mg/dL (calc) — ABNORMAL HIGH
Non-HDL Cholesterol (Calc): 143 mg/dL (calc) — ABNORMAL HIGH (ref <130)
Total CHOL/HDL Ratio: 5.5 (calc) — ABNORMAL HIGH (ref <5.0)
Triglycerides: 169 mg/dL — ABNORMAL HIGH (ref <150)

## 2021-08-14 LAB — HEMOGLOBIN A1C
Hgb A1c MFr Bld: 5.1 % of total Hgb (ref <5.7)
Mean Plasma Glucose: 100 mg/dL
eAG (mmol/L): 5.5 mmol/L

## 2022-05-06 ENCOUNTER — Other Ambulatory Visit: Payer: Self-pay | Admitting: Family Medicine

## 2022-05-06 DIAGNOSIS — A6002 Herpesviral infection of other male genital organs: Secondary | ICD-10-CM

## 2022-05-07 NOTE — Telephone Encounter (Signed)
Requested medication (s) are due for refill today - expired Rx  Requested medication (s) are on the active medication list -yes  Future visit scheduled -no  Last refill: 05/05/21 #90 3RF  Notes to clinic: Attempted to call patient to schedule appointment- left message to call office. Expired Rx  Requested Prescriptions  Pending Prescriptions Disp Refills   valACYclovir (VALTREX) 500 MG tablet [Pharmacy Med Name: VALACYCLOVIR 500MG  TABLETS] 90 tablet 3    Sig: TAKE 1 TABLET(500 MG) BY MOUTH DAILY     Antimicrobials:  Antiviral Agents - Anti-Herpetic Failed - 05/06/2022 12:24 PM      Failed - Valid encounter within last 12 months    Recent Outpatient Visits           1 year ago OSA on CPAP   [place], DO   2 years ago Mixed hyperlipidemia   [address], [place], DO   2 years ago Mixed hyperlipidemia   [place], DO   4 years ago Suspected sleep apnea   [place], DO   5 years ago Fatigue, unspecified type   [place], DO                 Requested Prescriptions  Pending Prescriptions Disp Refills   valACYclovir (VALTREX) 500 MG tablet [Pharmacy Med Name: VALACYCLOVIR 500MG  TABLETS] 90 tablet 3    Sig: TAKE 1 TABLET(500 MG) BY MOUTH DAILY     Antimicrobials:  Antiviral Agents - Anti-Herpetic Failed - 05/06/2022 12:24 PM      Failed - Valid encounter within last 12 months    Recent Outpatient Visits           1 year ago OSA on CPAP   [place], DO   2 years ago Mixed hyperlipidemia   [place], DO   2 years ago Mixed hyperlipidemia   [place],  DO   4 years ago Suspected sleep apnea   [place], DO   5 years ago Fatigue, unspecified type   [place], Devonne [place]ought[place], [place]

## 2022-05-08 ENCOUNTER — Telehealth (INDEPENDENT_AMBULATORY_CARE_PROVIDER_SITE_OTHER): Admitting: Family Medicine

## 2022-05-08 ENCOUNTER — Encounter: Payer: Self-pay | Admitting: Family Medicine

## 2022-05-08 DIAGNOSIS — E782 Mixed hyperlipidemia: Secondary | ICD-10-CM | POA: Diagnosis not present

## 2022-05-08 DIAGNOSIS — R7989 Other specified abnormal findings of blood chemistry: Secondary | ICD-10-CM | POA: Diagnosis not present

## 2022-05-08 DIAGNOSIS — A6002 Herpesviral infection of other male genital organs: Secondary | ICD-10-CM

## 2022-05-08 DIAGNOSIS — R7309 Other abnormal glucose: Secondary | ICD-10-CM | POA: Diagnosis not present

## 2022-05-08 DIAGNOSIS — G4733 Obstructive sleep apnea (adult) (pediatric): Secondary | ICD-10-CM | POA: Diagnosis not present

## 2022-05-08 MED ORDER — VALACYCLOVIR HCL 500 MG PO TABS: 500.0000 mg | ORAL_TABLET | Freq: Every day | ORAL | 3 refills | Status: DC

## 2022-05-08 NOTE — Assessment & Plan Note (Signed)
Last results 2023 Due for repeat Had improved TG Lifestyle contributing factor also may have genetic/familial component  Plan: Consider med options / plant based omega 3 Encourage improved lifestyle - low carb/cholesterol, reduce portion size, continue improving regular exercise  Return 1 week fasting lab

## 2022-05-08 NOTE — Assessment & Plan Note (Signed)
Well controlled, chronic OSA on CPAP - Good adherence to CPAP nightly - Continue current CPAP therapy, patient seems to be benefiting from therapy  

## 2022-05-08 NOTE — Progress Notes (Signed)
Subjective:    Patient ID: Devin Curtis, male    DOB: 1990/07/04, 32 y.o.   MRN: [id number]  Devin Curtis is a 32 y.o. male presenting on 05/08/2022 for No chief complaint on file.  Virtual / Telehealth Encounter - Video Visit via MyChart The purpose of this virtual visit is to provide medical care while limiting exposure to the novel coronavirus (COVID19) for both patient and office staff.  Consent was obtained for remote visit:  Yes.   Answered questions that patient had about telehealth interaction:  Yes.   I discussed the limitations, risks, security and privacy concerns of performing an evaluation and management service by video/telephone. I also discussed with the patient that there may be a patient responsible charge related to this service. The patient expressed understanding and agreed to proceed.  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 Not having flares or breakthrough symptoms Needs new order.   OSA on CPAP Last PSG done 04/2018 diagnosed sleep apnea and he was ordered for CPAP machine   - Today reports that sleep apnea is well controlled. He uses the CPAP machine every night. Tolerates the machine well, and thinks that sleeps better with it and feels good. No new concerns or symptoms. He will need updated orders for new CPAP supplies this year. He uses [facility].   HYPERLIPIDEMIA: - Reports no concerns. Last lipid panel 2023, elevated mild, LDL 114, prior 109 Not on medication Due for labs   Elevated LFT Improved on last lab  Due for repeat lab.       05/05/2021    4:09 PM 10/25/2019    8:19 AM 03/22/2018    2:02 PM  Depression screen PHQ 2/9  Decreased Interest 0 0 0  Down,  Depressed, Hopeless 0 0 0  PHQ - 2 Score 0 0 0  Altered sleeping 0    Tired, decreased energy 0    Change in appetite 0    Feeling bad or failure about yourself  0    Trouble concentrating 0    Moving slowly or fidgety/restless 0    Suicidal thoughts 0    PHQ-9 Score 0    Difficult doing work/chores Not difficult at all      Social History   Tobacco Use   Smoking status: Never   Smokeless tobacco: Never  Vaping Use   Vaping Use: Never used  Substance Use Topics   Alcohol use: Yes    Alcohol/week: 3.0 standard drinks of alcohol    Types: 3 Cans of beer per week    Comment: rarely, occasional   Drug use: No    Review of Systems Per HPI unless specifically indicated above     Objective:    There were no vitals taken for this visit.  Wt Readings from Last 3 Encounters:  05/05/21 204 lb (92.5 kg)  03/22/18 204 lb (92.5 kg)  10/12/16 200 lb 8 oz (90.9 kg)    Physical Exam  Note examination was completely remotely via video observation objective data only  Gen - well-appearing, no acute distress or apparent pain, comfortable HEENT - eyes appear clear without discharge or redness Heart/Lungs - cannot examine virtually - observed no evidence of coughing or labored breathing.  Abd - cannot examine virtually  Skin - face visible today- no rash Neuro - awake, alert, oriented Psych - not anxious appearing   Results for orders placed or performed in visit on 08/13/21  Hemoglobin A1c  Result Value Ref Range   Hgb A1c MFr Bld 5.1 <5.7 % of total Hgb   Mean Plasma Glucose 100 mg/dL   eAG (mmol/L) 5.5 mmol/L  Lipid panel  Result Value Ref Range   Cholesterol 175 <200 mg/dL   HDL 32 (L) > OR = 40 mg/dL   Triglycerides 169 (H) <150 mg/dL   LDL Cholesterol (Calc) 114 (H) mg/dL (calc)   Total CHOL/HDL Ratio 5.5 (H) <5.0 (calc)   Non-HDL Cholesterol (Calc) 143 (H) <130 mg/dL (calc)  CBC with Differential/Platelet  Result Value Ref Range   WBC 6.3 3.8 - 10.8 Thousand/uL    RBC 4.96 4.20 - 5.80 Million/uL   Hemoglobin 14.6 13.2 - 17.1 g/dL   HCT 42.8 38.5 - 50.0 %   MCV 86.3 80.0 - 100.0 fL   MCH 29.4 27.0 - 33.0 pg   MCHC 34.1 32.0 - 36.0 g/dL   RDW 13.0 11.0 - 15.0 %   Platelets 204 140 - 400 Thousand/uL   MPV 10.7 7.5 - 12.5 fL   Neutro Abs 3,925 1,500 - 7,800 cells/uL   Lymphs Abs 1,789 850 - 3,900 cells/uL   Absolute Monocytes 428 200 - 950 cells/uL   Eosinophils Absolute 107 15 - 500 cells/uL   Basophils Absolute 50 0 - 200 cells/uL   Neutrophils Relative % 62.3 %   Total Lymphocyte 28.4 %   Monocytes Relative 6.8 %   Eosinophils Relative 1.7 %   Basophils Relative 0.8 %  COMPLETE METABOLIC PANEL WITH GFR  Result Value Ref Range   Glucose, Bld 111 (H) 65 - 99 mg/dL   BUN 14 7 - 25 mg/dL   Creat 1.26 0.60 - 1.26 mg/dL   eGFR 79 > OR = 60 mL/min/1.36m2   BUN/Creatinine Ratio NOT APPLICABLE 6 - 22 (calc)   Sodium 141 135 - 146 mmol/L   Potassium 4.2 3.5 - 5.3 mmol/L   Chloride 104 98 - 110 mmol/L   CO2 29 20 - 32 mmol/L   Calcium 9.6 8.6 - 10.3 mg/dL   Total Protein 7.0 6.1 - 8.1 g/dL   Albumin 4.5 3.6 - 5.1 g/dL   Globulin 2.5 1.9 - 3.7 g/dL (calc)   AG Ratio 1.8 1.0 - 2.5 (calc)   Total Bilirubin 0.7 0.2 - 1.2 mg/dL   Alkaline phosphatase (APISO) 88 36 - 130 U/L   AST 25 10 - 40 U/L   ALT 54 (H) 9 - 46 U/L      Assessment & Plan:   Problem List Items Addressed This Visit     Herpes genitalis    Stable, chronic genital HSV2 Without flare On suppression therapy daily  Refill Valtrex 500mg  daily      Relevant Medications   valACYclovir (VALTREX) 500 MG tablet   Hyperlipidemia - Primary    Last results 2023 Due for repeat Had improved TG Lifestyle contributing factor also may have genetic/familial component  Plan: Consider med options / plant based omega 3 Encourage improved lifestyle - low carb/cholesterol, reduce portion size, continue improving regular exercise  Return 1 week fasting lab      Relevant Orders    COMPLETE METABOLIC PANEL WITH GFR   CBC with Differential/Platelet   Lipid panel   TSH   OSA on CPAP  Well controlled, chronic OSA on CPAP - Good adherence to CPAP nightly - Continue current CPAP therapy, patient seems to be benefiting from therapy       Relevant Orders   CBC with Differential/Platelet   Other Visit Diagnoses     Elevated LFTs       Relevant Orders   COMPLETE METABOLIC PANEL WITH GFR   Abnormal glucose       Relevant Orders   Hemoglobin A1c       Meds ordered this encounter  Medications   valACYclovir (VALTREX) 500 MG tablet    Sig: Take 1 tablet (500 mg total) by mouth daily.    Dispense:  90 tablet    Refill:  3   Orders Placed This Encounter  Procedures   COMPLETE METABOLIC PANEL WITH GFR    Standing Status:   Future    Standing Expiration Date:   08/17/2022   CBC with Differential/Platelet    Standing Status:   Future    Standing Expiration Date:   08/17/2022   Lipid panel    Standing Status:   Future    Standing Expiration Date:   08/17/2022    Order Specific Question:   Has the patient fasted?    Answer:   Yes   Hemoglobin A1c    Standing Status:   Future    Standing Expiration Date:   08/17/2022   TSH    Standing Status:   Future    Standing Expiration Date:   08/17/2022     Follow up plan: Return in about 1 week (around 05/15/2022) for 1 week fasting lab only for this year. Then return in 1 Year for Annual Physical.  Future labs ordered for 1 week  Patient verbalizes understanding with the above medical recommendations including the limitation of remote medical advice.  Specific follow-up and call-back criteria were given for patient to follow-up or seek medical care more urgently if needed.  Total duration of direct patient care provided via video conference: 10 minutes    Nobie Putnam, [facility] 05/08/2022, 11:25 AM

## 2022-05-08 NOTE — Assessment & Plan Note (Signed)
Stable, chronic genital HSV2 Without flare On suppression therapy daily  Refill Valtrex 500mg  daily

## 2022-05-08 NOTE — Patient Instructions (Addendum)
Refilled medication  DUE for FASTING BLOOD WORK (no food or drink after midnight before the lab appointment, only water or coffee without cream/sugar on the morning of)  SCHEDULE "Lab Only" visit in the morning at the clinic for lab draw in 1 WEEK  - Make sure Lab Only appointment is at about 1 week before your next appointment, so that results will be available  For Lab Results, once available within 2-3 days of blood draw, you can can log in to MyChart online to view your results and a brief explanation. Also, we can discuss results at next follow-up visit.   Please schedule a Follow-up Appointment to: Return in about 1 week (around 05/15/2022) for 1 week fasting lab only for this year. Then return in 1 Year for Annual Physical.  If you have any other questions or concerns, please feel free to call the office or send a message through [place]. You may also schedule an earlier appointment if necessary.  Additionally, you may be receiving a survey about your experience at our office within a few days to 1 week by e-mail or mail. We value your feedback.  Nobie Putnam, DO [place]

## 2022-05-13 ENCOUNTER — Other Ambulatory Visit: Payer: Self-pay

## 2022-05-13 DIAGNOSIS — R7309 Other abnormal glucose: Secondary | ICD-10-CM

## 2022-05-13 DIAGNOSIS — G4733 Obstructive sleep apnea (adult) (pediatric): Secondary | ICD-10-CM

## 2022-05-13 DIAGNOSIS — E782 Mixed hyperlipidemia: Secondary | ICD-10-CM

## 2022-05-13 DIAGNOSIS — R7989 Other specified abnormal findings of blood chemistry: Secondary | ICD-10-CM

## 2022-05-14 ENCOUNTER — Other Ambulatory Visit

## 2022-05-15 LAB — HEMOGLOBIN A1C
Hgb A1c MFr Bld: 5.5 % of total Hgb (ref <5.7)
Mean Plasma Glucose: 111 mg/dL
eAG (mmol/L): 6.2 mmol/L

## 2022-05-15 LAB — CBC WITH DIFFERENTIAL/PLATELET
Absolute Monocytes: 442 cells/uL (ref 200–950)
Basophils Absolute: 82 cells/uL (ref 0–200)
Basophils Relative: 1.2 %
Eosinophils Absolute: 204 cells/uL (ref 15–500)
Eosinophils Relative: 3 %
HCT: 45 % (ref 38.5–50.0)
Hemoglobin: 15.7 g/dL (ref 13.2–17.1)
Lymphs Abs: 2074 cells/uL (ref 850–3900)
MCH: 29.7 pg (ref 27.0–33.0)
MCHC: 34.9 g/dL (ref 32.0–36.0)
MCV: 85.2 fL (ref 80.0–100.0)
MPV: 11 fL (ref 7.5–12.5)
Monocytes Relative: 6.5 %
Neutro Abs: 3998 cells/uL (ref 1500–7800)
Neutrophils Relative %: 58.8 %
Platelets: 259 10*3/uL (ref 140–400)
RBC: 5.28 10*6/uL (ref 4.20–5.80)
RDW: 12.3 % (ref 11.0–15.0)
Total Lymphocyte: 30.5 %
WBC: 6.8 10*3/uL (ref 3.8–10.8)

## 2022-05-15 LAB — COMPLETE METABOLIC PANEL WITH GFR
AG Ratio: 1.9 (calc) (ref 1.0–2.5)
ALT: 78 U/L — ABNORMAL HIGH (ref 9–46)
AST: 30 U/L (ref 10–40)
Albumin: 4.9 g/dL (ref 3.6–5.1)
Alkaline phosphatase (APISO): 84 U/L (ref 36–130)
BUN/Creatinine Ratio: 13 (calc) (ref 6–22)
BUN: 19 mg/dL (ref 7–25)
CO2: 27 mmol/L (ref 20–32)
Calcium: 10 mg/dL (ref 8.6–10.3)
Chloride: 103 mmol/L (ref 98–110)
Creat: 1.42 mg/dL — ABNORMAL HIGH (ref 0.60–1.26)
Globulin: 2.6 g/dL (calc) (ref 1.9–3.7)
Glucose, Bld: 114 mg/dL — ABNORMAL HIGH (ref 65–99)
Potassium: 4.4 mmol/L (ref 3.5–5.3)
Sodium: 141 mmol/L (ref 135–146)
Total Bilirubin: 0.6 mg/dL (ref 0.2–1.2)
Total Protein: 7.5 g/dL (ref 6.1–8.1)
eGFR: 68 mL/min/{1.73_m2} (ref >=60)

## 2022-05-15 LAB — LIPID PANEL
Cholesterol: 173 mg/dL (ref <200)
HDL: 30 mg/dL — ABNORMAL LOW (ref >=40)
LDL Cholesterol (Calc): 107 mg/dL (calc) — ABNORMAL HIGH
Non-HDL Cholesterol (Calc): 143 mg/dL (calc) — ABNORMAL HIGH (ref <130)
Total CHOL/HDL Ratio: 5.8 (calc) — ABNORMAL HIGH (ref <5.0)
Triglycerides: 250 mg/dL — ABNORMAL HIGH (ref <150)

## 2022-05-15 LAB — TSH: TSH: 2.5 mIU/L (ref 0.40–4.50)

## 2023-05-13 ENCOUNTER — Encounter: Payer: Self-pay | Admitting: Family Medicine

## 2023-05-13 DIAGNOSIS — Z0000 Encounter for general adult medical examination without abnormal findings: Secondary | ICD-10-CM

## 2023-05-13 DIAGNOSIS — R7309 Other abnormal glucose: Secondary | ICD-10-CM

## 2023-05-13 DIAGNOSIS — E782 Mixed hyperlipidemia: Secondary | ICD-10-CM

## 2023-05-13 DIAGNOSIS — E538 Deficiency of other specified B group vitamins: Secondary | ICD-10-CM

## 2023-05-13 DIAGNOSIS — R7989 Other specified abnormal findings of blood chemistry: Secondary | ICD-10-CM

## 2023-05-13 NOTE — Addendum Note (Signed)
 Addended by: Smitty Cords on: 05/13/2023 02:43 PM   Modules accepted: Orders

## 2023-05-17 ENCOUNTER — Other Ambulatory Visit

## 2023-05-17 DIAGNOSIS — E538 Deficiency of other specified B group vitamins: Secondary | ICD-10-CM

## 2023-05-17 DIAGNOSIS — R7309 Other abnormal glucose: Secondary | ICD-10-CM

## 2023-05-17 DIAGNOSIS — E782 Mixed hyperlipidemia: Secondary | ICD-10-CM

## 2023-05-17 DIAGNOSIS — R7989 Other specified abnormal findings of blood chemistry: Secondary | ICD-10-CM

## 2023-05-17 DIAGNOSIS — Z0000 Encounter for general adult medical examination without abnormal findings: Secondary | ICD-10-CM

## 2023-05-18 LAB — COMPLETE METABOLIC PANEL WITHOUT GFR
AG Ratio: 2 (calc) (ref 1.0–2.5)
ALT: 32 U/L (ref 9–46)
AST: 15 U/L (ref 10–40)
Albumin: 4.7 g/dL (ref 3.6–5.1)
Alkaline phosphatase (APISO): 60 U/L (ref 36–130)
BUN: 19 mg/dL (ref 7–25)
CO2: 28 mmol/L (ref 20–32)
Calcium: 10.1 mg/dL (ref 8.6–10.3)
Chloride: 104 mmol/L (ref 98–110)
Creat: 1.26 mg/dL (ref 0.60–1.26)
Globulin: 2.3 g/dL (ref 1.9–3.7)
Glucose, Bld: 113 mg/dL — ABNORMAL HIGH (ref 65–99)
Potassium: 4.9 mmol/L (ref 3.5–5.3)
Sodium: 141 mmol/L (ref 135–146)
Total Bilirubin: 0.6 mg/dL (ref 0.2–1.2)
Total Protein: 7 g/dL (ref 6.1–8.1)

## 2023-05-18 LAB — VITAMIN B12: Vitamin B-12: 445 pg/mL (ref 200–1100)

## 2023-05-18 LAB — CBC WITH DIFFERENTIAL/PLATELET
Absolute Lymphocytes: 1891 {cells}/uL (ref 850–3900)
Absolute Monocytes: 331 {cells}/uL (ref 200–950)
Basophils Absolute: 52 {cells}/uL (ref 0–200)
Basophils Relative: 0.9 %
Eosinophils Absolute: 99 {cells}/uL (ref 15–500)
Eosinophils Relative: 1.7 %
HCT: 43.4 % (ref 38.5–50.0)
Hemoglobin: 14.8 g/dL (ref 13.2–17.1)
MCH: 29.2 pg (ref 27.0–33.0)
MCHC: 34.1 g/dL (ref 32.0–36.0)
MCV: 85.6 fL (ref 80.0–100.0)
MPV: 10.9 fL (ref 7.5–12.5)
Monocytes Relative: 5.7 %
Neutro Abs: 3428 {cells}/uL (ref 1500–7800)
Neutrophils Relative %: 59.1 %
Platelets: 229 10*3/uL (ref 140–400)
RBC: 5.07 10*6/uL (ref 4.20–5.80)
RDW: 12.6 % (ref 11.0–15.0)
Total Lymphocyte: 32.6 %
WBC: 5.8 10*3/uL (ref 3.8–10.8)

## 2023-05-18 LAB — LIPID PANEL
Cholesterol: 187 mg/dL (ref <200)
HDL: 32 mg/dL — ABNORMAL LOW (ref >=40)
LDL Cholesterol (Calc): 128 mg/dL — ABNORMAL HIGH
Non-HDL Cholesterol (Calc): 155 mg/dL — ABNORMAL HIGH (ref <130)
Total CHOL/HDL Ratio: 5.8 (calc) — ABNORMAL HIGH (ref <5.0)
Triglycerides: 152 mg/dL — ABNORMAL HIGH (ref <150)

## 2023-05-18 LAB — HEMOGLOBIN A1C
Hgb A1c MFr Bld: 5.3 %{Hb} (ref <5.7)
Mean Plasma Glucose: 105 mg/dL
eAG (mmol/L): 5.8 mmol/L

## 2023-05-18 LAB — TSH: TSH: 2.27 m[IU]/L (ref 0.40–4.50)

## 2023-05-21 ENCOUNTER — Other Ambulatory Visit: Payer: Self-pay | Admitting: Family Medicine

## 2023-05-21 ENCOUNTER — Ambulatory Visit: Admitting: Family Medicine

## 2023-05-21 ENCOUNTER — Encounter: Payer: Self-pay | Admitting: Family Medicine

## 2023-05-21 VITALS — BP 124/82 | HR 74 | Ht 71.0 in | Wt 200.0 lb

## 2023-05-21 DIAGNOSIS — E782 Mixed hyperlipidemia: Secondary | ICD-10-CM | POA: Diagnosis not present

## 2023-05-21 DIAGNOSIS — G4733 Obstructive sleep apnea (adult) (pediatric): Secondary | ICD-10-CM

## 2023-05-21 DIAGNOSIS — Z0000 Encounter for general adult medical examination without abnormal findings: Secondary | ICD-10-CM

## 2023-05-21 DIAGNOSIS — A6002 Herpesviral infection of other male genital organs: Secondary | ICD-10-CM

## 2023-05-21 DIAGNOSIS — Z23 Encounter for immunization: Secondary | ICD-10-CM | POA: Diagnosis not present

## 2023-05-21 DIAGNOSIS — R7309 Other abnormal glucose: Secondary | ICD-10-CM

## 2023-05-21 MED ORDER — VALACYCLOVIR HCL 500 MG PO TABS: 500.0000 mg | ORAL_TABLET | Freq: Every day | ORAL | 3 refills | Status: AC

## 2023-05-21 NOTE — Patient Instructions (Addendum)
 Thank you for coming to the office today.  Excellent labs today!  Keep up the great work! No major changes.  DUE for FASTING BLOOD WORK (no food or drink after midnight before the lab appointment, only water or coffee without cream/sugar on the morning of)  SCHEDULE "Lab Only" visit in the morning at the clinic for lab draw in 1 YEAR  - Make sure Lab Only appointment is at about 1 week before your next appointment, so that results will be available  For Lab Results, once available within 2-3 days of blood draw, you can can log in to MyChart online to view your results and a brief explanation. Also, we can discuss results at next follow-up visit.   Please schedule a Follow-up Appointment to: Return for 1 year fasting lab > 1 week later Annual Physical.  If you have any other questions or concerns, please feel free to call the office or send a message through MyChart. You may also schedule an earlier appointment if necessary.  Additionally, you may be receiving a survey about your experience at our office within a few days to 1 week by e-mail or mail. We value your feedback.  Saralyn Pilar, DO [facility], [place]

## 2023-05-21 NOTE — Progress Notes (Signed)
 Subjective:    Patient ID: Devin Curtis, male    DOB: Dec 18, 1990, 33 y.o.   MRN: [id number]  Devin Curtis is a 33 y.o. male presenting on 05/21/2023 for Annual Exam   HPI  Discussed the use of AI scribe software for clinical note transcription with the patient, who gave verbal consent to proceed.  History of Present Illness   Devin Curtis is a 33 year old male who presents for an annual physical exam.  He has experienced random dizziness over the past couple of weeks, which has since subsided. Initially, he noted dizziness during workouts after drastically reducing carbohydrate intake with fasting. He has since reintroduced carbohydrates gradually to manage his energy levels better.  Hyperlipidemia, mixed BMI >27 He has been monitoring his cholesterol and triglyceride levels. He has made significant lifestyle changes, including losing 40 pounds through fasting and reducing carbohydrate intake. His history of elevated liver enzymes has normalized with weight loss.   Elevated Creatinine - improved Lab shows Creatinine back to baseline now - His kidney function, as indicated by creatinine levels, has been stable over the years, with occasional fluctuations possibly related to hydration status. He recently started using protein supplements, which may affect creatinine levels.  OSA on CPAP - Today reports that sleep apnea is well controlled. He uses the CPAP machine every night. Tolerates the machine well, and thinks that sleeps better with it and feels good. No new concerns or symptoms.  He has also quit drinking alcohol, which he believes has contributed positively to his health.   Health Maintenance: Due Tdap today     05/21/2023   10:42 AM 05/05/2021    4:09 PM 10/25/2019    8:19 AM  Depression screen PHQ 2/9  Decreased Interest 0 0 0  Down, Depressed, Hopeless 0 0 0  PHQ - 2 Score 0 0 0  Altered sleeping 0 0   Tired, decreased energy 0 0   Change in appetite 0 0   Feeling  bad or failure about yourself  0 0   Trouble concentrating 0 0   Moving slowly or fidgety/restless 0 0   Suicidal thoughts 0 0   PHQ-9 Score 0 0   Difficult doing work/chores  Not difficult at all        05/21/2023   10:42 AM 05/05/2021    4:09 PM 08/31/2016    3:46 PM  GAD 7 : Generalized Anxiety Score  Nervous, Anxious, on Edge 0 0 0  Control/stop worrying 0 0 0  Worry too much - different things 0 0 0  Trouble relaxing 0 0 0  Restless 0 0 0  Easily annoyed or irritable 0 0 0  Afraid - awful might happen 0 0 0  Total GAD 7 Score 0 0 0  Anxiety Difficulty  Not difficult at all Not difficult at all     History reviewed. No pertinent past medical history. 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   Occupation: Retired Electronics engineer (2018)    Comment: Medical leave after Foot  Surgery  Tobacco Use   Smoking status: Never   Smokeless tobacco: Never  Vaping Use   Vaping status: Never Used  Substance and Sexual Activity   Alcohol use: Yes    Alcohol/week: 3.0 standard drinks of alcohol    Types: 3 Cans of beer per week    Comment: rarely, occasional   Drug use: No   Sexual activity: Yes  Other Topics Concern   Not on file  Social History Narrative   Not on file   Social Drivers of Health   Financial Resource Strain: Low Risk  (05/17/2023)   Overall Financial Resource Strain (CARDIA)    Difficulty of Paying Living Expenses: Not hard at all  Food Insecurity: No Food Insecurity (05/17/2023)   Hunger Vital Sign    Worried About Running Out of Food in the Last Year:  Never true    Ran Out of Food in the Last Year: Never true  Transportation Needs: No Transportation Needs (05/17/2023)   PRAPARE - Administrator, Civil Service (Medical): No    Lack of Transportation (Non-Medical): No  Physical Activity: Sufficiently Active (05/17/2023)   Exercise Vital Sign    Days of Exercise per Week: 7 days    Minutes of Exercise per Session: 60 min  Stress: No Stress Concern Present (05/17/2023)   [employer] of Occupational Health - Occupational Stress Questionnaire    Feeling of Stress : Only a little  Social Connections: Moderately Integrated (05/17/2023)   Social Connection and Isolation Panel [NHANES]    Frequency of Communication with Friends and Family: More than three times a week    Frequency of Social Gatherings with Friends and Family: Once a week    Attends Religious Services: 1 to 4 times per year    Active Member of [employer] or Organizations: No    Attends Engineer, structural: Not on file    Marital Status: Married  Catering manager Violence: Not on file   Family History  Problem Relation Age of Onset   Cancer Mother 68       ovarian (51) / breast cancer (55)   Ovarian cancer Mother 43   Breast cancer Mother 102       remission   Other Mother        Pre-Diabetes   Heart disease Father    Heart attack Father 61   No current outpatient medications on file prior to visit.   No current facility-administered medications on file prior to visit.    Review of Systems  Constitutional:  Negative for activity change, appetite change, chills, diaphoresis, fatigue and fever.  HENT:  Negative for congestion and hearing loss.   Eyes:  Negative for visual disturbance.  Respiratory:  Negative for cough, chest tightness, shortness of breath and wheezing.   Cardiovascular:  Negative for chest pain, palpitations and leg swelling.  Gastrointestinal:  Negative for abdominal pain, constipation, diarrhea, nausea and vomiting.   Genitourinary:  Negative for dysuria, frequency and hematuria.  Musculoskeletal:  Negative for arthralgias and neck pain.  Skin:  Negative for rash.  Neurological:  Negative for dizziness, weakness, light-headedness, numbness and headaches.  Hematological:  Negative for adenopathy.  Psychiatric/Behavioral:  Negative for behavioral problems, dysphoric mood and sleep disturbance.    Per HPI unless specifically indicated above     Objective:    BP 124/82 (BP Location: Right Arm, Patient Position: Sitting, Cuff Size: Normal)   Pulse 74   Ht 5\' 11"  (1.803 m)  Wt 200 lb (90.7 kg)   BMI 27.89 kg/m   Wt Readings from Last 3 Encounters:  05/21/23 200 lb (90.7 kg)  05/05/21 204 lb (92.5 kg)  03/22/18 204 lb (92.5 kg)    Physical Exam Vitals and nursing note reviewed.  Constitutional:      General: He is not in acute distress.    Appearance: He is well-developed. He is not diaphoretic.     Comments: Well-appearing, comfortable, cooperative  HENT:     Head: Normocephalic and atraumatic.     Right Ear: Tympanic membrane, ear canal and external ear normal. There is no impacted cerumen.     Left Ear: Tympanic membrane, ear canal and external ear normal. There is no impacted cerumen.  Eyes:     General:        Right eye: No discharge.        Left eye: No discharge.     Conjunctiva/sclera: Conjunctivae normal.     Pupils: Pupils are equal, round, and reactive to light.  Neck:     Thyroid: No thyromegaly.     Vascular: No carotid bruit.  Cardiovascular:     Rate and Rhythm: Normal rate and regular rhythm.     Pulses: Normal pulses.     Heart sounds: Normal heart sounds. No murmur heard. Pulmonary:     Effort: Pulmonary effort is normal. No respiratory distress.     Breath sounds: Normal breath sounds. No wheezing or rales.  Abdominal:     General: Bowel sounds are normal. There is no distension.     Palpations: Abdomen is soft. There is no mass.     Tenderness: There is no  abdominal tenderness.  Musculoskeletal:        General: No tenderness. Normal range of motion.     Cervical back: Normal range of motion and neck supple.     Right lower leg: No edema.     Left lower leg: No edema.     Comments: Upper / Lower Extremities: - Normal muscle tone, strength bilateral upper extremities 5/5, lower extremities 5/5  Lymphadenopathy:     Cervical: No cervical adenopathy.  Skin:    General: Skin is warm and dry.     Findings: No erythema or rash.  Neurological:     Mental Status: He is alert and oriented to person, place, and time.     Comments: Distal sensation intact to light touch all extremities  Psychiatric:        Mood and Affect: Mood normal.        Behavior: Behavior normal.        Thought Content: Thought content normal.     Comments: Well groomed, good eye contact, normal speech and thoughts     Results for orders placed or performed in visit on 05/17/23  Vitamin B12   Collection Time: 05/17/23  7:58 AM  Result Value Ref Range   Vitamin B-12 445 200 - 1,100 pg/mL  CBC with Differential/Platelet   Collection Time: 05/17/23  7:58 AM  Result Value Ref Range   WBC 5.8 3.8 - 10.8 Thousand/uL   RBC 5.07 4.20 - 5.80 Million/uL   Hemoglobin 14.8 13.2 - 17.1 g/dL   HCT 16.1 09.6 - 04.5 %   MCV 85.6 80.0 - 100.0 fL   MCH 29.2 27.0 - 33.0 pg   MCHC 34.1 32.0 - 36.0 g/dL   RDW 40.9 81.1 - 91.4 %   Platelets 229 140 - 400 Thousand/uL   MPV  10.9 7.5 - 12.5 fL   Neutro Abs 3,428 1,500 - 7,800 cells/uL   Absolute Lymphocytes 1,891 850 - 3,900 cells/uL   Absolute Monocytes 331 200 - 950 cells/uL   Eosinophils Absolute 99 15 - 500 cells/uL   Basophils Absolute 52 0 - 200 cells/uL   Neutrophils Relative % 59.1 %   Total Lymphocyte 32.6 %   Monocytes Relative 5.7 %   Eosinophils Relative 1.7 %   Basophils Relative 0.9 %  COMPLETE METABOLIC PANEL WITHOUT GFR   Collection Time: 05/17/23  7:58 AM  Result Value Ref Range   Glucose, Bld 113 (H) 65 - 99  mg/dL   BUN 19 7 - 25 mg/dL   Creat 1.61 0.96 - 0.45 mg/dL   BUN/Creatinine Ratio SEE NOTE: 6 - 22 (calc)   Sodium 141 135 - 146 mmol/L   Potassium 4.9 3.5 - 5.3 mmol/L   Chloride 104 98 - 110 mmol/L   CO2 28 20 - 32 mmol/L   Calcium 10.1 8.6 - 10.3 mg/dL   Total Protein 7.0 6.1 - 8.1 g/dL   Albumin 4.7 3.6 - 5.1 g/dL   Globulin 2.3 1.9 - 3.7 g/dL (calc)   AG Ratio 2.0 1.0 - 2.5 (calc)   Total Bilirubin 0.6 0.2 - 1.2 mg/dL   Alkaline phosphatase (APISO) 60 36 - 130 U/L   AST 15 10 - 40 U/L   ALT 32 9 - 46 U/L  Hemoglobin A1c   Collection Time: 05/17/23  7:58 AM  Result Value Ref Range   Hgb A1c MFr Bld 5.3 <5.7 % of total Hgb   Mean Plasma Glucose 105 mg/dL   eAG (mmol/L) 5.8 mmol/L  Lipid panel   Collection Time: 05/17/23  7:58 AM  Result Value Ref Range   Cholesterol 187 <200 mg/dL   HDL 32 (L) > OR = 40 mg/dL   Triglycerides 409 (H) <150 mg/dL   LDL Cholesterol (Calc) 128 (H) mg/dL (calc)   Total CHOL/HDL Ratio 5.8 (H) <5.0 (calc)   Non-HDL Cholesterol (Calc) 155 (H) <130 mg/dL (calc)  TSH   Collection Time: 05/17/23  7:58 AM  Result Value Ref Range   TSH 2.27 0.40 - 4.50 mIU/L      Assessment & Plan:   Problem List Items Addressed This Visit     Herpes genitalis   Relevant Medications   valACYclovir (VALTREX) 500 MG tablet   Hyperlipidemia   OSA on CPAP   Other Visit Diagnoses       Annual physical exam    -  Primary     Need for diphtheria-tetanus-pertussis (Tdap) vaccine       Relevant Orders   Tdap vaccine greater than or equal to 7yo IM (Completed)        Updated Health Maintenance information Reviewed recent lab results with patient Encouraged improvement to lifestyle with diet and exercise Goal of weight loss   Dizziness - resolved Intermittent dizziness subsided with dietary adjustments. Reduced carbohydrate intake may have contributed during workouts. - Monitor symptoms and report recurrence. - Maintain balanced diet with gradual  carbohydrate adjustments.  Hyperlipidemia Slightly elevated cholesterol with LDL in 120s, triglycerides at 152 mg/dL, HDL in 81X. No immediate treatment due to age and levels. Lifestyle changes improved triglycerides. - Continue lifestyle modifications. - Monitor cholesterol levels annually.  Obstructive Sleep Apnea Nightly CPAP use improves sleep quality. No changes needed. - Continue CPAP use nightly.  General Health Maintenance Tdap vaccine due. He agreed to receive it.  -  Administer Tdap vaccine. - Schedule annual labs for next year. - Continue current lifestyle modifications.  HSV, recurrent Valtrex 500mg  daily suppression, re order  Follow-up Routine follow-up for annual wellness and lab work discussed. - Schedule next annual wellness visit for April 10th or April 24th next year.         Orders Placed This Encounter  Procedures   Tdap vaccine greater than or equal to 7yo IM    Meds ordered this encounter  Medications   valACYclovir (VALTREX) 500 MG tablet    Sig: Take 1 tablet (500 mg total) by mouth daily.    Dispense:  90 tablet    Refill:  3     Follow up plan: Return for 1 year fasting lab > 1 week later Annual Physical.  Future labs 05/26/24  Saralyn Pilar, DO [facility] 05/21/2023, 11:08 AM

## 2023-09-08 ENCOUNTER — Encounter: Payer: Self-pay | Admitting: Family Medicine

## 2023-11-19 ENCOUNTER — Ambulatory Visit
Admission: RE | Admit: 2023-11-19 | Discharge: 2023-11-19 | Disposition: A | Discharge status: Home / Self Care | Attending: Emergency Medicine | Admitting: Emergency Medicine

## 2023-11-19 VITALS — BP 134/89 | HR 99 | Temp 98.0°F | Resp 18

## 2023-11-19 DIAGNOSIS — J069 Acute upper respiratory infection, unspecified: Secondary | ICD-10-CM

## 2023-11-19 LAB — POC COVID19/FLU A&B COMBO
Covid Antigen, POC: NEGATIVE
Influenza A Antigen, POC: NEGATIVE
Influenza B Antigen, POC: NEGATIVE

## 2023-11-19 LAB — POCT RAPID STREP A (OFFICE): Rapid Strep A Screen: NEGATIVE

## 2023-11-19 NOTE — ED Provider Notes (Signed)
 Devin Curtis    CSN: [id number] Arrival date & time: 11/19/23  1100      History   Chief Complaint Chief Complaint  Patient presents with   Nasal Congestion    A lot of congestion in my sinuses and pain. Body aches and not feeling well - Entered by patient   Headache   Generalized Body Aches    HPI Devin Curtis is a 33 y.o. male.  Patient presents with 1 day history of bodyaches, headache, ear pain, congestion, sore throat, cough.  No fever, shortness of breath, vomiting, diarrhea.  No OTC medications taken today; took NyQuil last night.  The history is provided by the patient and medical records.    History reviewed. No pertinent past medical history.  Patient Active Problem List   Diagnosis Date Noted   OSA on CPAP 03/22/2018   Hyperlipidemia 10/12/2016   Herpes genitalis 08/31/2016   Gynecomastia, male 08/31/2016   Tension headache 08/31/2016    Past Surgical History:  Procedure Laterality Date   FOOT SURGERY  04/10/2015   WISDOM TOOTH EXTRACTION  2014       Home Medications    Prior to Admission medications   Medication Sig Start Date End Date Taking? Authorizing Provider  valACYclovir  (VALTREX ) 500 MG tablet Take 1 tablet (500 mg total) by mouth daily. 05/21/23   Edman Marsa PARAS, DO    Family History Family History  Problem Relation Age of Onset   Cancer Mother 22       ovarian (66) / breast cancer (55)   Ovarian cancer Mother 37   Breast cancer Mother 27       remission   Other Mother        Pre-Diabetes   Heart disease Father    Heart attack Father 22    Social History Social History   Tobacco Use   Smoking status: Never   Smokeless tobacco: Never  Vaping Use   Vaping status: Never Used  Substance Use Topics   Alcohol use: Yes    Alcohol/week: 3.0 standard drinks of alcohol    Types: 3 Cans of beer per week    Comment: rarely, occasional   Drug use: No     Allergies   Penicillins   Review of  Systems Review of Systems  Constitutional:  Negative for chills and fever.  HENT:  Positive for congestion, ear pain, rhinorrhea and sore throat.   Respiratory:  Positive for cough. Negative for shortness of breath.   Gastrointestinal:  Negative for diarrhea and vomiting.  Neurological:  Positive for headaches.     Physical Exam Triage Vital Signs ED Triage Vitals  Encounter Vitals Group     BP 11/19/23 1112 134/89     Girls Systolic BP Percentile --      Girls Diastolic BP Percentile --      Boys Systolic BP Percentile --      Boys Diastolic BP Percentile --      Pulse Rate 11/19/23 1112 99     Resp 11/19/23 1112 18     Temp 11/19/23 1112 98 F (36.7 C)     Temp src --      SpO2 11/19/23 1112 98 %     Weight --      Height --      Head Circumference --      Peak Flow --      Pain Score 11/19/23 1116 3     Pain Loc --  Pain Education --      Exclude from Growth Chart --    No data found.  Updated Vital Signs BP 134/89   Pulse 99   Temp 98 F (36.7 C)   Resp 18   SpO2 98%   Visual Acuity Right Eye Distance:   Left Eye Distance:   Bilateral Distance:    Right Eye Near:   Left Eye Near:    Bilateral Near:     Physical Exam Constitutional:      General: He is not in acute distress. HENT:     Right Ear: Tympanic membrane normal.     Left Ear: Tympanic membrane normal.     Nose: Rhinorrhea present.     Mouth/Throat:     Mouth: Mucous membranes are moist.     Pharynx: Oropharynx is clear.  Cardiovascular:     Rate and Rhythm: Normal rate and regular rhythm.     Heart sounds: Normal heart sounds.  Pulmonary:     Effort: Pulmonary effort is normal. No respiratory distress.     Breath sounds: Normal breath sounds.  Neurological:     Mental Status: He is alert.      UC Treatments / Results  Labs (all labs ordered are listed, but only abnormal results are displayed) Labs Reviewed  POC COVID19/FLU A&B COMBO  POCT RAPID STREP A (OFFICE)     EKG   Radiology No results found.  Procedures Procedures (including critical care time)  Medications Ordered in UC Medications - No data to display  Initial Impression / Assessment and Plan / UC Course  I have reviewed the triage vital signs and the nursing notes.  Pertinent labs & imaging results that were available during my care of the patient were reviewed by me and considered in my medical decision making (see chart for details).    Viral URI.  Afebrile and vital signs are stable.  Lungs are clear and O2 sat is 98% on room air.  Rapid strep negative.  Rapid COVID and flu negative.  Discussed symptomatic treatment including Tylenol or ibuprofen as needed for fever or discomfort, plain Mucinex as needed for congestion, rest, hydration.  Instructed patient to follow-up with his PCP if not improving.  ED precautions given.  Patient agrees to plan of care.  Final Clinical Impressions(s) / UC Diagnoses   Final diagnoses:  Viral URI     Discharge Instructions      The COVID and flu and strep tests are negative.   Take Tylenol or ibuprofen as needed for fever or discomfort.  Take plain Mucinex as needed for congestion.  Rest and keep yourself hydrated.    Follow-up with your primary care provider if your symptoms are not improving.         ED Prescriptions   None    PDMP not reviewed this encounter.   Corlis Burnard DEL, NP 11/19/23 1154

## 2023-11-19 NOTE — Discharge Instructions (Addendum)
 The COVID and flu and strep tests are negative.   Take Tylenol or ibuprofen as needed for fever or discomfort.  Take plain Mucinex as needed for congestion.  Rest and keep yourself hydrated.    Follow-up with your primary care provider if your symptoms are not improving.

## 2023-11-19 NOTE — ED Triage Notes (Signed)
 Pt being seen in UC for sinus congestion, headache, generalized body aches, dry cough, sore throat, and bilateral ear pain. Pt reports s/s started yesterday. Pt denies fever. Pt reports taking NyQuil last night with no relief. Pt reports children haven't been feeling well.

## 2024-01-02 ENCOUNTER — Ambulatory Visit
Admission: EM | Admit: 2024-01-02 | Discharge: 2024-01-02 | Disposition: A | Discharge status: Home / Self Care | Attending: Emergency Medicine | Admitting: Emergency Medicine

## 2024-01-02 DIAGNOSIS — J069 Acute upper respiratory infection, unspecified: Secondary | ICD-10-CM | POA: Diagnosis not present

## 2024-01-02 LAB — POCT RAPID STREP A (OFFICE): Rapid Strep A Screen: NEGATIVE

## 2024-01-02 NOTE — Discharge Instructions (Addendum)
Your strep test is negative.  Take Tylenol or ibuprofen as needed.  Follow up with your primary care provider if your symptoms are not improving.   ° ° °

## 2024-01-02 NOTE — ED Triage Notes (Signed)
 Patient to Urgent Care with complaints of  headache/ cough/ sore throat.   Reports symptoms started Friday. Multiple family members with strep.  Taking dayquil.

## 2024-01-02 NOTE — ED Provider Notes (Signed)
 Devin Curtis    CSN: [id number] Arrival date & time: 01/02/24  1545      History   Chief Complaint Chief Complaint  Patient presents with   Sore Throat    HPI Devin Curtis is a 33 y.o. male.  Patient presents with 2-day history of sore throat, cough, headache.  No fever, shortness of breath, rash, arthralgias.  Patient is concerned for strep as his wife and son had strep throat last week.  The history is provided by the patient and medical records.    History reviewed. No pertinent past medical history.  Patient Active Problem List   Diagnosis Date Noted   OSA on CPAP 03/22/2018   Hyperlipidemia 10/12/2016   Herpes genitalis 08/31/2016   Gynecomastia, male 08/31/2016   Tension headache 08/31/2016    Past Surgical History:  Procedure Laterality Date   FOOT SURGERY  04/10/2015   WISDOM TOOTH EXTRACTION  2014       Home Medications    Prior to Admission medications   Medication Sig Start Date End Date Taking? Authorizing Provider  valACYclovir  (VALTREX ) 500 MG tablet Take 1 tablet (500 mg total) by mouth daily. 05/21/23   Edman Marsa PARAS, DO    Family History Family History  Problem Relation Age of Onset   Cancer Mother 15       ovarian (40) / breast cancer (55)   Ovarian cancer Mother 40   Breast cancer Mother 27       remission   Other Mother        Pre-Diabetes   Heart disease Father    Heart attack Father 6    Social History Social History   Tobacco Use   Smoking status: Never   Smokeless tobacco: Never  Vaping Use   Vaping status: Never Used  Substance Use Topics   Alcohol use: Yes    Alcohol/week: 3.0 standard drinks of alcohol    Types: 3 Cans of beer per week    Comment: rarely, occasional   Drug use: No     Allergies   Penicillins   Review of Systems Review of Systems  Constitutional:  Negative for chills and fever.  HENT:  Positive for sore throat. Negative for ear pain.   Respiratory:  Positive for  cough. Negative for shortness of breath.   Gastrointestinal:  Negative for diarrhea and vomiting.  Musculoskeletal:  Negative for arthralgias and joint swelling.  Skin:  Negative for color change and rash.  Neurological:  Positive for headaches.     Physical Exam Triage Vital Signs ED Triage Vitals  Encounter Vitals Group     BP      Girls Systolic BP Percentile      Girls Diastolic BP Percentile      Boys Systolic BP Percentile      Boys Diastolic BP Percentile      Pulse      Resp      Temp      Temp src      SpO2      Weight      Height      Head Circumference      Peak Flow      Pain Score      Pain Loc      Pain Education      Exclude from Growth Chart    No data found.  Updated Vital Signs BP 136/88   Pulse 91   Temp 98 F (36.7 C)  Resp 18   SpO2 98%   Visual Acuity Right Eye Distance:   Left Eye Distance:   Bilateral Distance:    Right Eye Near:   Left Eye Near:    Bilateral Near:     Physical Exam Constitutional:      General: He is not in acute distress. HENT:     Right Ear: Tympanic membrane normal.     Left Ear: Tympanic membrane normal.     Nose: Nose normal.     Mouth/Throat:     Mouth: Mucous membranes are moist.     Pharynx: Posterior oropharyngeal erythema present.  Cardiovascular:     Rate and Rhythm: Normal rate and regular rhythm.     Heart sounds: Normal heart sounds.  Pulmonary:     Effort: Pulmonary effort is normal. No respiratory distress.     Breath sounds: Normal breath sounds.  Neurological:     Mental Status: He is alert.      UC Treatments / Results  Labs (all labs ordered are listed, but only abnormal results are displayed) Labs Reviewed  POCT RAPID STREP A (OFFICE) - Normal    EKG   Radiology No results found.  Procedures Procedures (including critical care time)  Medications Ordered in UC Medications - No data to display  Initial Impression / Assessment and Plan / UC Course  I have reviewed  the triage vital signs and the nursing notes.  Pertinent labs & imaging results that were available during my care of the patient were reviewed by me and considered in my medical decision making (see chart for details).    Viral URI.  Afebrile and vital signs are stable.  Lungs are clear and O2 sat is 98% on room air.  Rapid strep negative.  Patient declines COVID or flu testing.  Discussed symptomatic treatment including Tylenol or ibuprofen as needed.  Instructed him to follow-up with his PCP if he is not improving.  He agrees to plan of care.  Final Clinical Impressions(s) / UC Diagnoses   Final diagnoses:  Viral URI     Discharge Instructions      Your strep test is negative.  Take Tylenol or ibuprofen as needed.  Follow-up with your primary care provider if your symptoms are not improving.     ED Prescriptions   None    PDMP not reviewed this encounter.   Corlis Burnard DEL, NP 01/02/24 [phone]

## 2024-05-26 ENCOUNTER — Other Ambulatory Visit

## 2024-06-09 ENCOUNTER — Encounter: Admitting: Family Medicine

## 2024-06-12 ENCOUNTER — Encounter: Admitting: Family Medicine
